# Patient Record
Sex: Male | Born: 1955 | Race: Black or African American | Hispanic: No | Marital: Single | Smoking: Current every day smoker
Health system: Southern US, Community
[De-identification: ages and names within clinical notes are randomized; demographics above are authoritative.]

## PROBLEM LIST (undated history)

## (undated) DIAGNOSIS — K859 Acute pancreatitis without necrosis or infection, unspecified: Secondary | ICD-10-CM

## (undated) DIAGNOSIS — I10 Essential (primary) hypertension: Secondary | ICD-10-CM

---

## 1998-03-19 ENCOUNTER — Emergency Department (HOSPITAL_COMMUNITY): Admission: EM | Admit: 1998-03-19 | Discharge: 1998-03-19 | Payer: Self-pay | Admitting: Emergency Medicine

## 1998-09-11 ENCOUNTER — Inpatient Hospital Stay: Admission: RE | Admit: 1998-09-11 | Discharge: 1998-09-13 | Payer: Self-pay | Admitting: *Deleted

## 1998-09-11 ENCOUNTER — Encounter: Payer: Self-pay | Admitting: Emergency Medicine

## 2000-04-26 ENCOUNTER — Emergency Department (HOSPITAL_COMMUNITY): Admission: EM | Admit: 2000-04-26 | Discharge: 2000-04-26 | Payer: Self-pay | Admitting: Emergency Medicine

## 2000-11-11 ENCOUNTER — Encounter: Payer: Self-pay | Admitting: Emergency Medicine

## 2000-11-11 ENCOUNTER — Emergency Department (HOSPITAL_COMMUNITY): Admission: EM | Admit: 2000-11-11 | Discharge: 2000-11-11 | Payer: Self-pay | Admitting: Emergency Medicine

## 2004-04-14 ENCOUNTER — Emergency Department (HOSPITAL_COMMUNITY): Admission: EM | Admit: 2004-04-14 | Discharge: 2004-04-14 | Payer: Self-pay | Admitting: Emergency Medicine

## 2004-04-23 ENCOUNTER — Inpatient Hospital Stay (HOSPITAL_COMMUNITY): Admission: RE | Admit: 2004-04-23 | Discharge: 2004-04-29 | Payer: Self-pay | Admitting: Internal Medicine

## 2004-04-23 ENCOUNTER — Encounter: Admission: RE | Admit: 2004-04-23 | Discharge: 2004-04-23 | Payer: Self-pay | Admitting: Internal Medicine

## 2004-05-05 ENCOUNTER — Ambulatory Visit (HOSPITAL_COMMUNITY): Admission: RE | Admit: 2004-05-05 | Discharge: 2004-05-05 | Payer: Self-pay | Admitting: Internal Medicine

## 2004-05-24 ENCOUNTER — Encounter: Admission: RE | Admit: 2004-05-24 | Discharge: 2004-05-24 | Payer: Self-pay | Admitting: Internal Medicine

## 2004-08-05 ENCOUNTER — Inpatient Hospital Stay (HOSPITAL_COMMUNITY): Admission: EM | Admit: 2004-08-05 | Discharge: 2004-09-06 | Payer: Self-pay | Admitting: Emergency Medicine

## 2004-08-05 ENCOUNTER — Ambulatory Visit: Payer: Self-pay | Admitting: Internal Medicine

## 2004-08-09 ENCOUNTER — Ambulatory Visit: Payer: Self-pay | Admitting: Infectious Diseases

## 2004-09-13 ENCOUNTER — Ambulatory Visit: Payer: Self-pay | Admitting: Internal Medicine

## 2004-09-30 ENCOUNTER — Ambulatory Visit: Payer: Self-pay | Admitting: Internal Medicine

## 2007-04-19 ENCOUNTER — Ambulatory Visit: Payer: Self-pay | Admitting: Internal Medicine

## 2007-04-20 ENCOUNTER — Ambulatory Visit: Payer: Self-pay | Admitting: *Deleted

## 2010-02-08 ENCOUNTER — Emergency Department (HOSPITAL_COMMUNITY): Admission: EM | Admit: 2010-02-08 | Discharge: 2010-02-08 | Payer: Self-pay | Admitting: Emergency Medicine

## 2010-02-18 ENCOUNTER — Emergency Department (HOSPITAL_COMMUNITY): Admission: EM | Admit: 2010-02-18 | Discharge: 2010-02-18 | Payer: Self-pay | Admitting: Emergency Medicine

## 2011-02-25 NOTE — Discharge Summary (Signed)
NAME:  Elijah Werner, Elijah Werner NO.:  [id number]   MEDICAL RECORD NO.:  [id number]                   PATIENT TYPE:  INP   LOCATION:  5727                                 FACILITY:  [facility]   PHYSICIAN:  Ellie Lunch, M.D.                   DATE OF BIRTH:  [birth year]   DATE OF ADMISSION:  DATE OF DISCHARGE:  04/29/2004                                 DISCHARGE SUMMARY   PRIMARY CARE PHYSICIAN:  Dr. Renae Fickle, Outpatient [facility].   CONSULTATIONS:  Dr. Judie Petit. Ruel Favors.   DISCHARGE DIAGNOSES:  1. Acute on chronic pancreatitis secondary to alcohol abuse.  2. 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  3. Status post CT-guided percutaneous drainage on April 26, 2004.  4. Leukocytosis secondary to #1.  5. Thrombocytosis secondary to #1.  6. History of alcohol use, last drink April 12, 2004.  7. Ongoing tobacco abuse.   DISCHARGE MEDICATIONS:  1. Multivitamin p.o. daily.  2. Nicotine patch 21 mg, to be applied every morning and to be removed at     night.  3. Wellbutrin 150 mg one tablet p.o. daily.   DISPOSITION AND FOLLOWUP:  The patient has been scheduled for a repeat CT  scan of the abdomen on May 05, 2004, for recurrence of the pseudocyst.  He  has been scheduled to see Dr. Miles Costain following the CT scan of the abdomen on  May 05, 2004.  He will continue to keep his drain per interventional  radiologist's instructions.  He has also been set up to have Home Health  come and change dressings daily, and also to drain all the fluid every day.  He has also been given instructions on using oral contrast prior to CT scan  on May 05, 2004.  He will also be followed up by Dr. Renae Fickle in the outpatient  clinic in August.  He will get a CBC at that visit to make sure that the  leukocytosis and the thrombocytosis have resolved.   PROCEDURES DONE:  1. CT scan of the abdomen performed on April 24, 2004, showed a 19 cm     pancreatic pseudocyst, and  evidence of chronic pancreatitis such as large     globular calcifications, ductal dilatation, as well as atrophy of the     pancreas.  2. An ultrasound-guided percutaneous drainage was performed of the     pseudocyst on April 27, 2004, by Dr. Miles Costain and 1400 cc of pancreatic     pseudocyst fluid were aspirated, and a 10-French drainage catheter was     inserted.   ADMISSION HISTORY:  Mr. Fantroy is a 55 year old African American male with a  history of heavy alcohol use and pancreatitis that was admitted because of a  history of a few weeks of nausea, decreased p.o. intake, abdominal  discomfort, and increasing abdominal distension. He had  been evaluated in  the ED prior to admission with mild pancreatitis.  The patient, because of  abdominal distension, the patient is unable to eat any solid food, and has  also been unable to keep down water since the past three to four days.  The  patient was admitted.  The patient denied any blood in stools or any  vomiting.   SUBSTANCE HISTORY:  The patient is a current smoker and smokes 1 to 1.5  packs per day for the past 20 years.  The patient used to drink a lot of  beer for 20 to 30 years but has quit since April 12, 2004.   SOCIAL HISTORY:  The patient is single and is self pay and unemployed, and  lives with mother.   FAMILY MEDICAL HISTORY:  Mother alive at age 79, and she has hypertension.  Father, no history could be obtained.  Siblings:  He has seven siblings, and  almost everybody has hypertension.   PHYSICAL EXAMINATION:  VITAL SIGNS:  Pulse 96, blood pressure 112/73,  temperature 98, respirations 20, O2 saturations 100% on room air.  RESPIRATIONS:  Clear to auscultation bilateral.  CARDIOVASCULAR:  Regular rate and rhythm.  GI:  Abdomen was firm, distended.  It was nontender.  There were some bowel  sounds.  There was no guarding, rigidity or rebound tenderness.  EXTREMITIES:  No cyanosis, clubbing or edema.   ADMISSION LABORATORY  DATA:  Sodium 140, potassium 4.9, chloride 101,  bicarbonate 29, BUN 6, creatinine 0.8.  Glucose 117.  Hemoglobin 12.1 with  MCV of 91.3.  White blood count 22.1, platelets 744,000.  Bilirubin 0.6,  alkaline phosphatase 161.  SGOT 28, SGPT 27, protein 7, album 2.8, calcium  9.5.  Ethyl alcohol was less than 5.  Magnesium 2.4.  Lipase 62, amylase  206.   Abdominal x-ray showed distension of the stomach consistent with gastric  outlet obstruction and extensive pancreatic calcifications.   HOSPITAL COURSE:  Problem #1.  Acute on chronic pancreatitis.  The patient  was initially kept N.P.O. and a nasogastric suction was applied.  The  patient did not complain of any pain during hospitalization.  After  percutaneous drainage of the pseudocyst, the patient was started on clear  liquids and advanced to a regular diet which he tolerated very well prior to  discharge.  He has been advised to quit drinking as well as smoking.  Problem #2.  A 90 cm pseudocyst of the pancreas.  A CT scan was obtained of  the abdomen because of gastric outlet obstruction noted on abdominal film.  It indicated a 19 cm pseudocyst of the pancreas.  A GI consultation was  initially obtained, and Dr. Marina Goodell was consulted.  It was later determined  that Dr. Marina Goodell does not do any endoscopic stenting of the pseudocyst  anymore.  Following that, a surgical consultation was obtained.  Finally a  surgery consultation was obtained, and they suggested that the patient get a  CT guided percutaneous drainage, and if that were not possible, then the  patient will need to have assist gastrostomy.  Thus, interventional  radiology was consulted, and it was agreed that the patient get an  ultrasound-guided, percutaneous drainage. This procedure was performed on  April 27, 2004, and 1400 cc of cloudy, green fluid were removed, and a 10 -  [place] drain was inserted.  Following drainage, the patient was started on clear liquids and advanced  to a regular diet, which the patient tolerated  very well.  The patient is to continue having the drain placed in the  pseudocyst, and will be followed up by interventional radiology in one week.  The patient has also been scheduled for a followup CT scan of the abdomen  for recurrence of the pseudocyst on May 05, 2004, prior to the followup  appointment with the radiologist.  Home Health has also been arranged for  daily dressing changes as well as emptying the drain.  Problem #3. Leukocytosis most likely secondary to #1.  The patient's white  blood cell count continued to decrease during hospitalization stay, and the  discharge white blood count was 16.3.  The patient will receive followup CBC  in three to four weeks to confirm resolution.  Blood cultures were obtained  and were negative.  Also pseudocyst fluid was sent for cell count with  differential as well as gram stain and culture, and was negative for any  white blood cells or organisms.  Problem #4.  Thrombocytosis.  The patient continued to have an elevated  platelet count during hospitalization stay.  This was thought to be reactive  thrombocytosis secondary to the pancreatitis.  A ferritin level was obtained  to rule out iron deficiency anemia and was 340.  A repeat followup CBC will  be obtained in three to four weeks to confirm resolution.  Problem #4.  Alcohol use.  The patient's last drink was on April 12, 2004.  The patient is determined to quit drinking, and has been strongly advised to  do so.  Also, the patient was on thiamine and folate during hospitalization.  He was also carefully monitored for DT's and did not experience any DT's  during hospitalization.  RBC and folate level was obtained and was normal  (346).  The patient will be discharged home on a multivitamin to be taken  every day.  Problem #5.  Ongoing tobacco use.  The patient smokes 1 to 1-/2 packs per  day.  He was placed on a nicotine patch during  hospitalization stay which  proved to be very beneficial per patient.  He is being discharged home on  nicotine patches as well as Wellbutrin samples.  He is very motivated to  quit smoking.  This will be addressed again during followup visits.  The  patient's oxygenation saturations were between 96 and 98% on room air during  hospitalization.  The patient did not show any signs of chronic obstructive  pulmonary disease.   DISCHARGE LABORATORY DATA:  BMP:  Sodium 140, potassium 4.9, chloride 106,  bicarbonate 28, glucose 94, BUN 1, creatinine 0.8, calcium 9.2.  Hemoglobin  12.1.  White blood count 16.3, platelets 857,000, MCV 91.9.   Pertinent labs, TSH 1.2, B12 450, ferritin 340, rbc's __________and folate  346, and amylase 206, and lipase 62.   DISCHARGE VITALS:  Temperature 98.8, pulse 76, respirations 20, blood  pressure 123/75, O2 saturations 98% on room air.   DISCHARGE INSTRUCTIONS:  1. The patient has been instructed on quitting smoking as well as alcohol    use.  He has also been instructed on a low fat diet because of chronic     pancreatitis.  The patient does not have any malabsorption symptoms as of     now.  He has been provided with a handout on low-fat food options.  2. He has also been instructed on all of his followup appointments.  Ellie Lunch, M.D.    BP/MEDQ  D:  04/28/2004  T:  04/29/2004  Job:  [id number]

## 2011-02-25 NOTE — Consult Note (Signed)
NAME:  Elijah Werner, Elijah Werner                            ACCOUNT NO.:  [id number]   MEDICAL RECORD NO.:  [id number]                   PATIENT TYPE:  INP   LOCATION:  5727                                 FACILITY:  [facility]   PHYSICIAN:  Gabrielle Dare. Janee Morn, M.D.             DATE OF BIRTH:  [birth year]   DATE OF CONSULTATION:  04/26/2004  DATE OF DISCHARGE:                                   CONSULTATION   REASON FOR CONSULTATION:  Pancreatic pseudocyst.   HISTORY OF PRESENT ILLNESS:  The patient is a 55 year old African American  male with a history of alcohol abuse who was admitted with gastric symptoms  to the internal medicine teaching service.  Workup revealed a 19 cm acute on  chronic pancreatitis with a large pancreatic pseudocyst.  The patient has  been treated with NG tube decompression and volume resuscitation.  He was  evaluated by GI for possible drainage of the pseudocyst endoscopically.  They felt it was too large of a pseudocyst.  It is causing primary gastric  outlet obstructive symptoms and I was asked to evaluate him.  He currently  denies any significant abdominal pain.   PAST MEDICAL HISTORY:  Alcohol abuse and pancreatitis.   PAST SURGICAL HISTORY:  Inguinal hernia at age 39.   MEDICATIONS CURRENTLY:  Protonix, vitamin B1, Phenergan, nicotine patch,  morphine.   SOCIAL HISTORY:  He has a history of heavy alcohol abuse, though he has not  drank since July 4.  He is also a current smoker.  He is single.  He was  previously in the [employer]   FAMILY HISTORY:  His mother is 67 years old and is alive and has high blood  pressure.  He has several siblings, all with hypertension.  He does not know  of any history in his father.   ALLERGIES:  No known drug allergies.   REVIEW OF SYMPTOMS:  GENERAL:  Some weight loss, but otherwise negative.  CARDIOVASCULAR:  Negative.  RESPIRATORY:  Negative.  GI:  Nausea and  vomiting and abdominal pain.  GU:  Negative.  NEUROLOGICAL:  Negative.   The  remainder of review of systems is  negative.   PHYSICAL EXAMINATION:  VITAL SIGNS:  Temperature 99, blood pressure 124/80,  heart rate 74, respirations 20.  GENERAL:  He is awake, alert, in no acute distress.  HEENT:  Pupils are equal.  NECK:  Supple with no tenderness or masses.  LUNGS:  Clear to auscultation with normal respiratory excursion.  HEART:  Regular rate and rhythm, PMI palpable along the left chest.  ABDOMEN:  Distended, there are bowel sounds present, no appreciable  tenderness is noted.  He has a definite fullness in his left upper quadrant  area.  He has no inguinal herniae.  SKIN:  Warm and dry.   LABORATORY DATA:  White blood cell count 19.3, hemoglobin 12, platelets 742.  Basic metabolic profile  was unremarkable except for a glucose of 126.  Other  data reviewed included CT scan demonstrating a 19 cm pancreatic pseudocyst  abutting his stomach.   IMPRESSION:  Large pancreatic pseudocyst approximately 19 cm with gastric  outlet obstructive symptoms.   RECOMMENDATIONS:  I feel this pseudocyst would be best treated by CT guided  percutaneous drainage done by interventional radiology.  If that is not  possible, we would need to proceed with a cyst gastrostomy in the operating  room.  I discussed this recommendation with Dr. Lavera Guise from the patient's  primary service and they are going to discuss things with radiology.  I will  follow along with you and if it is not possible to drain this percutaneously  by radiology, we will plan on surgery this week.   Thank you very much for this consultation.                                               Gabrielle Dare Janee Morn, M.D.    BET/MEDQ  D:  04/26/2004  T:  04/26/2004  Job:  [id number]

## 2011-05-10 ENCOUNTER — Emergency Department (HOSPITAL_COMMUNITY)
Admission: EM | Admit: 2011-05-10 | Discharge: 2011-05-10 | Disposition: A | Discharge status: Home / Self Care | Payer: Non-veteran care | Attending: Emergency Medicine | Admitting: Emergency Medicine

## 2011-05-10 DIAGNOSIS — H9190 Unspecified hearing loss, unspecified ear: Secondary | ICD-10-CM | POA: Insufficient documentation

## 2011-05-10 DIAGNOSIS — H6120 Impacted cerumen, unspecified ear: Secondary | ICD-10-CM | POA: Insufficient documentation

## 2013-06-14 ENCOUNTER — Encounter (HOSPITAL_COMMUNITY): Payer: Self-pay | Admitting: Emergency Medicine

## 2013-06-14 ENCOUNTER — Emergency Department (HOSPITAL_COMMUNITY)
Admission: EM | Admit: 2013-06-14 | Discharge: 2013-06-14 | Disposition: A | Discharge status: Home / Self Care | Payer: Non-veteran care | Attending: Emergency Medicine | Admitting: Emergency Medicine

## 2013-06-14 DIAGNOSIS — F17200 Nicotine dependence, unspecified, uncomplicated: Secondary | ICD-10-CM | POA: Insufficient documentation

## 2013-06-14 DIAGNOSIS — L0231 Cutaneous abscess of buttock: Secondary | ICD-10-CM | POA: Insufficient documentation

## 2013-06-14 MED ORDER — HYDROCODONE-ACETAMINOPHEN 5-325 MG PO TABS: 1.0000 | ORAL_TABLET | ORAL | Status: DC | PRN

## 2013-06-14 NOTE — ED Notes (Signed)
Pt c/o left sided buttocks abscess; pt denies drainage

## 2013-06-14 NOTE — ED Provider Notes (Signed)
Medical screening examination/treatment/procedure(s) were performed by non-physician practitioner and as supervising physician I was immediately available for consultation/collaboration.   Dagmar Hait, MD 06/14/13 1536

## 2013-06-14 NOTE — ED Notes (Addendum)
States has had "sore" on left buttock x 3 days.

## 2013-06-14 NOTE — ED Provider Notes (Signed)
CSN: [id number]     Arrival date & time 06/14/13  1144 History   First MD Initiated Contact with Patient 06/14/13 1302     Chief Complaint  Patient presents with  . Abscess   (Consider location/radiation/quality/duration/timing/severity/associated sxs/prior Treatment) Patient is a 57 y.o. male presenting with abscess. The history is provided by the patient. No language interpreter was used.  Abscess Location:  Ano-genital Ano-genital abscess location:  L buttock Abscess quality: painful   Abscess quality: not draining   Red streaking: no   Associated symptoms: no fever   Associated symptoms comment:  Painful swollen area to left buttock that is now causing pain when sitting. No drainage.    History reviewed. No pertinent past medical history. History reviewed. No pertinent past surgical history. History reviewed. No pertinent family history. History  Substance Use Topics  . Smoking status: Current Every Day Smoker  . Smokeless tobacco: Not on file  . Alcohol Use: No    Review of Systems  Constitutional: Negative for fever.  Skin:       Hard nodular lesion to left buttock without drainage, ulceration or bleeding. No redness. No perirectal pain or swelling.     Allergies  Review of patient's allergies indicates no known allergies.  Home Medications   Current Outpatient Rx  Name  Route  Sig  Dispense  Refill  . dextran 70-hypromellose (TEARS RENEWED) ophthalmic solution   Both Eyes   Place 1 drop into both eyes 3 (three) times daily as needed.         . Multiple Vitamins-Minerals (MULTIVITAMIN WITH MINERALS) tablet   Oral   Take 1 tablet by mouth daily.         Marland Kitchen VITAMIN E PO   Oral   Take 1 capsule by mouth daily.          BP 126/74  Temp(Src) 97.7 F (36.5 C) (Oral)  Resp 18  Wt 125 lb 3.2 oz (56.79 kg)  SpO2 95% Physical Exam  ED Course  Procedures (including critical care time) Labs Review Labs Reviewed - No data to display Imaging Review No  results found.  MDM  No diagnosis found. 1. Sebaceous cyst/abscess  INCISION AND DRAINAGE Performed by: Elpidio Anis A Consent: Verbal consent obtained. Risks and benefits: risks, benefits and alternatives were discussed Type: abscess  Body area: left buttock  Anesthesia: local infiltration  Incision was made with a scalpel.  Local anesthetic: lidocaine 2% w/ epinephrine  Anesthetic total: 2 ml  Complexity: complex Blunt dissection to break up loculations  Drainage: purulent  Drainage amount: moderate, include sebaceous material  Packing material: 1/4 in iodoform gauze  Patient tolerance: Patient tolerated the procedure well with no immediate complications.       Arnoldo Hooker, PA-C 06/14/13 1340

## 2013-06-16 ENCOUNTER — Encounter (HOSPITAL_COMMUNITY): Payer: Self-pay | Admitting: *Deleted

## 2013-06-16 ENCOUNTER — Emergency Department (HOSPITAL_COMMUNITY)
Admission: EM | Admit: 2013-06-16 | Discharge: 2013-06-16 | Disposition: A | Discharge status: Home / Self Care | Payer: Non-veteran care | Attending: Emergency Medicine | Admitting: Emergency Medicine

## 2013-06-16 DIAGNOSIS — Z4801 Encounter for change or removal of surgical wound dressing: Secondary | ICD-10-CM | POA: Insufficient documentation

## 2013-06-16 DIAGNOSIS — F17200 Nicotine dependence, unspecified, uncomplicated: Secondary | ICD-10-CM | POA: Insufficient documentation

## 2013-06-16 DIAGNOSIS — Z5189 Encounter for other specified aftercare: Secondary | ICD-10-CM

## 2013-06-16 DIAGNOSIS — Z79899 Other long term (current) drug therapy: Secondary | ICD-10-CM | POA: Insufficient documentation

## 2013-06-16 NOTE — ED Provider Notes (Signed)
CSN: [id number]     Arrival date & time 06/16/13  4098 History   First MD Initiated Contact with Patient 06/16/13 0825     Chief Complaint  Patient presents with  . Wound Check   (Consider location/radiation/quality/duration/timing/severity/associated sxs/prior Treatment) HPI This 57 year old male is here for a scheduled wound check after having incision and drainage of left buttocks abscess 2 days ago, he has no complaints, his dressing has remained in place as she was previously directed and has a drainage packing in place, he has had no fever, minimal intermittent pain, no constant pain, no severe pain, no surrounding redness, no abdominal pain vomiting or other concerns. History reviewed. No pertinent past medical history. History reviewed. No pertinent past surgical history. No family history on file. History  Substance Use Topics  . Smoking status: Current Every Day Smoker  . Smokeless tobacco: Not on file  . Alcohol Use: No    Review of Systems See HPI. Allergies  Review of patient's allergies indicates no known allergies.  Home Medications   Current Outpatient Rx  Name  Route  Sig  Dispense  Refill  . dextran 70-hypromellose (TEARS RENEWED) ophthalmic solution   Both Eyes   Place 1 drop into both eyes 3 (three) times daily as needed.         . Multiple Vitamins-Minerals (MULTIVITAMIN WITH MINERALS) tablet   Oral   Take 1 tablet by mouth daily.         Marland Kitchen VITAMIN E PO   Oral   Take 1 capsule by mouth daily.          BP 150/71  Pulse 64  Temp(Src) 97.8 F (36.6 C) (Oral)  Resp 18  SpO2 100% Physical Exam  Nursing note and vitals reviewed. Constitutional:  Awake, alert, nontoxic appearance.  HENT:  Head: Atraumatic.  Eyes: Right eye exhibits no discharge. Left eye exhibits no discharge.  Neck: Neck supple.  Cardiovascular: Normal rate and regular rhythm.   No murmur heard. Pulmonary/Chest: Effort normal and breath sounds normal. No respiratory  distress. He has no wheezes. He has no rales. He exhibits no tenderness.  Abdominal: Soft. Bowel sounds are normal. There is no tenderness. There is no rebound.  Genitourinary:  Packing removed from left buttocks drained abscess area, minimal purulence drainage, no surrounding erythema to suggest cellulitis, minimal if any localized tenderness, no fluctuance no crepitus, abscess appears to be healing well  Musculoskeletal: He exhibits no tenderness.  Baseline ROM, no obvious new focal weakness.  Neurological: He is alert.  Mental status and motor strength appears baseline for patient and situation.  Skin: No rash noted.  Psychiatric: He has a normal mood and affect.    ED Course  Procedures (including critical care time) Patient / Family / Caregiver informed of clinical course, understand medical decision-making process, and agree with plan. Labs Review Labs Reviewed - No data to display Imaging Review No results found.  MDM   1. Wound check, abscess    I doubt any other EMC precluding discharge at this time including, but not necessarily limited to the following:sepsis, nec fasciitis.    Hurman Horn, MD 06/17/13 [phone]

## 2013-06-16 NOTE — ED Notes (Signed)
Pt requesting wound re-check of L buttock abscess. Site is healing appropriately. No drainage noted. Denies pain at the time.

## 2013-06-16 NOTE — ED Notes (Signed)
Pt is here to have abscess to buttocks rechecked and pt has packing in site.

## 2013-06-16 NOTE — ED Notes (Signed)
Pt instructed to follow up with [facility] in x2 weeks for re-check. Pt discharged home, has no further questions.

## 2015-04-29 ENCOUNTER — Emergency Department (HOSPITAL_COMMUNITY): Payer: Non-veteran care

## 2015-04-29 ENCOUNTER — Emergency Department (HOSPITAL_COMMUNITY)
Admission: EM | Admit: 2015-04-29 | Discharge: 2015-04-29 | Disposition: A | Discharge status: Home / Self Care | Payer: Self-pay | Attending: Emergency Medicine | Admitting: Emergency Medicine

## 2015-04-29 ENCOUNTER — Emergency Department (HOSPITAL_COMMUNITY): Payer: Self-pay

## 2015-04-29 ENCOUNTER — Encounter (HOSPITAL_COMMUNITY): Payer: Self-pay | Admitting: General Practice

## 2015-04-29 DIAGNOSIS — R001 Bradycardia, unspecified: Secondary | ICD-10-CM | POA: Insufficient documentation

## 2015-04-29 DIAGNOSIS — D72829 Elevated white blood cell count, unspecified: Secondary | ICD-10-CM | POA: Insufficient documentation

## 2015-04-29 DIAGNOSIS — R109 Unspecified abdominal pain: Secondary | ICD-10-CM

## 2015-04-29 DIAGNOSIS — Z720 Tobacco use: Secondary | ICD-10-CM | POA: Insufficient documentation

## 2015-04-29 DIAGNOSIS — R319 Hematuria, unspecified: Secondary | ICD-10-CM

## 2015-04-29 DIAGNOSIS — M62830 Muscle spasm of back: Secondary | ICD-10-CM | POA: Insufficient documentation

## 2015-04-29 DIAGNOSIS — R918 Other nonspecific abnormal finding of lung field: Secondary | ICD-10-CM | POA: Insufficient documentation

## 2015-04-29 DIAGNOSIS — Z79899 Other long term (current) drug therapy: Secondary | ICD-10-CM | POA: Insufficient documentation

## 2015-04-29 DIAGNOSIS — M546 Pain in thoracic spine: Secondary | ICD-10-CM

## 2015-04-29 DIAGNOSIS — N200 Calculus of kidney: Secondary | ICD-10-CM | POA: Insufficient documentation

## 2015-04-29 DIAGNOSIS — Z8719 Personal history of other diseases of the digestive system: Secondary | ICD-10-CM | POA: Insufficient documentation

## 2015-04-29 DIAGNOSIS — E875 Hyperkalemia: Secondary | ICD-10-CM | POA: Insufficient documentation

## 2015-04-29 HISTORY — DX: Acute pancreatitis without necrosis or infection, unspecified: K85.90

## 2015-04-29 LAB — COMPREHENSIVE METABOLIC PANEL
ALBUMIN: 4.4 g/dL (ref 3.5–5.0)
ALT: 24 U/L (ref 17–63)
ANION GAP: 4 — AB (ref 5–15)
AST: 26 U/L (ref 15–41)
Alkaline Phosphatase: 77 U/L (ref 38–126)
BILIRUBIN TOTAL: 0.6 mg/dL (ref 0.3–1.2)
BUN: 10 mg/dL (ref 6–20)
CO2: 31 mmol/L (ref 22–32)
Calcium: 10.1 mg/dL (ref 8.9–10.3)
Chloride: 107 mmol/L (ref 101–111)
Creatinine, Ser: 0.8 mg/dL (ref 0.61–1.24)
GFR calc Af Amer: >60 mL/min (ref >60)
GFR calc non Af Amer: >60 mL/min (ref >60)
GLUCOSE: 82 mg/dL (ref 65–99)
Potassium: 6 mmol/L — ABNORMAL HIGH (ref 3.5–5.1)
SODIUM: 142 mmol/L (ref 135–145)
TOTAL PROTEIN: 6.9 g/dL (ref 6.5–8.1)

## 2015-04-29 LAB — URINALYSIS, ROUTINE W REFLEX MICROSCOPIC
Glucose, UA: NEGATIVE mg/dL
Ketones, ur: 15 mg/dL — AB
NITRITE: NEGATIVE
PH: 5.5 (ref 5.0–8.0)
Protein, ur: 100 mg/dL — AB
SPECIFIC GRAVITY, URINE: 1.025 (ref 1.005–1.030)
UROBILINOGEN UA: 1 mg/dL (ref 0.0–1.0)

## 2015-04-29 LAB — CBC WITH DIFFERENTIAL/PLATELET
BASOS ABS: 0 10*3/uL (ref 0.0–0.1)
BASOS PCT: 0 % (ref 0–1)
EOS ABS: 0.3 10*3/uL (ref 0.0–0.7)
Eosinophils Relative: 2 % (ref 0–5)
HCT: 41.9 % (ref 39.0–52.0)
HEMOGLOBIN: 14.1 g/dL (ref 13.0–17.0)
LYMPHS ABS: 2.4 10*3/uL (ref 0.7–4.0)
LYMPHS PCT: 17 % (ref 12–46)
MCH: 31.8 pg (ref 26.0–34.0)
MCHC: 33.7 g/dL (ref 30.0–36.0)
MCV: 94.6 fL (ref 78.0–100.0)
MONO ABS: 0.6 10*3/uL (ref 0.1–1.0)
MONOS PCT: 4 % (ref 3–12)
NEUTROS ABS: 11 10*3/uL — AB (ref 1.7–7.7)
Neutrophils Relative %: 77 % (ref 43–77)
Platelets: 168 10*3/uL (ref 150–400)
RBC: 4.43 MIL/uL (ref 4.22–5.81)
RDW: 15 % (ref 11.5–15.5)
WBC: 14.3 10*3/uL — AB (ref 4.0–10.5)

## 2015-04-29 LAB — URINE MICROSCOPIC-ADD ON

## 2015-04-29 LAB — POTASSIUM: Potassium: 4.5 mmol/L (ref 3.5–5.1)

## 2015-04-29 LAB — CK: CK TOTAL: 176 U/L (ref 49–397)

## 2015-04-29 LAB — I-STAT TROPONIN, ED: Troponin i, poc: 0 ng/mL (ref 0.00–0.08)

## 2015-04-29 LAB — LIPASE, BLOOD: LIPASE: 17 U/L — AB (ref 22–51)

## 2015-04-29 MED ORDER — HYDROCODONE-ACETAMINOPHEN 5-325 MG PO TABS: 1.0000 | ORAL_TABLET | Freq: Four times a day (QID) | ORAL | Status: DC | PRN

## 2015-04-29 MED ORDER — IOHEXOL 300 MG/ML  SOLN
80.0000 mL | Freq: Once | INTRAMUSCULAR | Status: AC | PRN
  Administered 2015-04-29: 100 mL via INTRAVENOUS

## 2015-04-29 MED ORDER — MORPHINE SULFATE 2 MG/ML IJ SOLN
2.0000 mg | Freq: Once | INTRAMUSCULAR | Status: AC
  Administered 2015-04-29: 2 mg via INTRAVENOUS
  Filled 2015-04-29: qty 1

## 2015-04-29 MED ORDER — TAMSULOSIN HCL 0.4 MG PO CAPS: 0.4000 mg | ORAL_CAPSULE | Freq: Every day | ORAL | Status: DC

## 2015-04-29 MED ORDER — SODIUM CHLORIDE 0.9 % IV BOLUS (SEPSIS)
1000.0000 mL | Freq: Once | INTRAVENOUS | Status: AC
  Administered 2015-04-29: 1000 mL via INTRAVENOUS

## 2015-04-29 MED ORDER — HYDROCODONE-ACETAMINOPHEN 5-325 MG PO TABS
1.0000 | ORAL_TABLET | Freq: Once | ORAL | Status: AC
  Administered 2015-04-29: 1 via ORAL
  Filled 2015-04-29: qty 1

## 2015-04-29 NOTE — ED Provider Notes (Signed)
Pt awaits chest CT scan to evaluate for lung mass.  ?smoking hx.  No PCP.  Need close f/u.  L flank pain.    4:04 PM Chest CT scan showing evidence of a benign tumor (hamartoma).  No evidence of lung infection.  Since pt has L flank pain, and finding which may suggest kidney stone, will provide pain medication and f/u with urologist for further care.  Resources provided and encourage pt to f/u with PCP for monitoring of his health.    BP 124/94 mmHg  Pulse 42  Temp(Src) 97.9 F (36.6 C) (Oral)  Resp 17  Ht  (1.727 m)  Wt 135 lb (61.236 kg)  BMI 20.53 kg/m2  SpO2 97%  I have reviewed nursing notes and vital signs. I personally viewed the imaging tests through PACS system and agrees with radiologist's intepretation I reviewed available ER/hospitalization records through the EMR  Results for orders placed or performed during the hospital encounter of 04/29/15  Urinalysis, Routine w reflex microscopic (not at [facility])  Result Value Ref Range   Color, Urine Kost (A) YELLOW   APPearance TURBID (A) CLEAR   Specific Gravity, Urine 1.025 1.005 - 1.030   pH 5.5 5.0 - 8.0   Glucose, UA NEGATIVE NEGATIVE mg/dL   Hgb urine dipstick LARGE (A) NEGATIVE   Bilirubin Urine MODERATE (A) NEGATIVE   Ketones, ur 15 (A) NEGATIVE mg/dL   Protein, ur 829 (A) NEGATIVE mg/dL   Urobilinogen, UA 1.0 0.0 - 1.0 mg/dL   Nitrite NEGATIVE NEGATIVE   Leukocytes, UA MODERATE (A) NEGATIVE  CBC with Differential  Result Value Ref Range   WBC 14.3 (H) 4.0 - 10.5 K/uL   RBC 4.43 4.22 - 5.81 MIL/uL   Hemoglobin 14.1 13.0 - 17.0 g/dL   HCT 56.2 13.0 - 86.5 %   MCV 94.6 78.0 - 100.0 fL   MCH 31.8 26.0 - 34.0 pg   MCHC 33.7 30.0 - 36.0 g/dL   RDW 78.4 69.6 - 29.5 %   Platelets 168 150 - 400 K/uL   Neutrophils Relative % 77 43 - 77 %   Neutro Abs 11.0 (H) 1.7 - 7.7 K/uL   Lymphocytes Relative 17 12 - 46 %   Lymphs Abs 2.4 0.7 - 4.0 K/uL   Monocytes Relative 4 3 - 12 %   Monocytes Absolute 0.6 0.1 - 1.0 K/uL    Eosinophils Relative 2 0 - 5 %   Eosinophils Absolute 0.3 0.0 - 0.7 K/uL   Basophils Relative 0 0 - 1 %   Basophils Absolute 0.0 0.0 - 0.1 K/uL  Comprehensive metabolic panel  Result Value Ref Range   Sodium 142 135 - 145 mmol/L   Potassium 6.0 (H) 3.5 - 5.1 mmol/L   Chloride 107 101 - 111 mmol/L   CO2 31 22 - 32 mmol/L   Glucose, Bld 82 65 - 99 mg/dL   BUN 10 6 - 20 mg/dL   Creatinine, Ser 2.84 0.61 - 1.24 mg/dL   Calcium 13.2 8.9 - 44.0 mg/dL   Total Protein 6.9 6.5 - 8.1 g/dL   Albumin 4.4 3.5 - 5.0 g/dL   AST 26 15 - 41 U/L   ALT 24 17 - 63 U/L   Alkaline Phosphatase 77 38 - 126 U/L   Total Bilirubin 0.6 0.3 - 1.2 mg/dL   GFR calc non Af Amer >60 >60 mL/min   GFR calc Af Amer >60 >60 mL/min   Anion gap 4 (L) 5 - 15  Lipase, blood  Result Value Ref Range   Lipase 17 (L) 22 - 51 U/L  CK  Result Value Ref Range   Total CK 176 49 - 397 U/L  Urine microscopic-add on  Result Value Ref Range   WBC, UA 3-6 <3 WBC/hpf   RBC / HPF TOO NUMEROUS TO COUNT <3 RBC/hpf   Bacteria, UA FEW (A) RARE  Potassium  Result Value Ref Range   Potassium 4.5 3.5 - 5.1 mmol/L  I-stat troponin, ED  Result Value Ref Range   Troponin i, poc 0.00 0.00 - 0.08 ng/mL   Comment 3           Ct Chest W Contrast  04/29/2015   CLINICAL DATA:  59 year old male with left lower lung mass on CT Abdomen and Pelvis today. Initial encounter.  EXAM: CT CHEST WITH CONTRAST  TECHNIQUE: Multidetector CT imaging of the chest was performed during intravenous contrast administration.  CONTRAST:  OMNIPAQUE IOHEXOL 300 MG/ML  SOLN  COMPARISON:  CT Abdomen and Pelvis 1345 hours today. CT Abdomen and Pelvis 05/05/2004.  FINDINGS: Negative thoracic inlet. Mild cardiomegaly. No pericardial effusion. Minimal calcified plaque at the left subclavian artery origin, otherwise negative visualized aorta. Other major mediastinal vascular structures appear within normal limits. No mediastinal or hilar lymphadenopathy.  No  axillary lymphadenopathy. Stable visualized upper abdominal viscera, including dilated main pancreatic duct and pancreatic tail atrophy. Mild periportal edema suggested.  No osseous abnormality identified.  Minimal retained secretions along the left wall of the upper trachea (series 3, image 11). Major airways otherwise are patent. The right lung is negative. No pleural effusion.  There is mild mostly dependent opacity in the left lower lobe compatible. There is a round mildly lobulated left lower lobe anterior basal segment lung nodule measuring 18 x 24 x 17 mm (AP by transverse by CC). This has a course 7 mm calcification which is slightly off center. Elsewhere the lesion is somewhat low density (20-30 Hounsfield units). This is identified in 2005 (series 2, image 6 at that time) measuring up to 14 mm. The left lung otherwise is negative.  IMPRESSION: 1. 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 2. Mild patchy left lower lobe opacity elsewhere might reflect scarring or atelectasis. Trace retained secretions in the trachea. 3. No other acute findings in the chest. 4. Stable visualized upper abdomen including pancreatic atrophy with pancreatic ductal dilatation, and suggestion of mild periportal edema.   Electronically Signed   By: Odessa Fleming M.D.   On: 04/29/2015 15:54   Ct Renal Stone Study  04/29/2015   CLINICAL DATA:  Left flank pain, hematuria  EXAM: CT ABDOMEN AND PELVIS WITHOUT CONTRAST  TECHNIQUE: Multidetector CT imaging of the abdomen and pelvis was performed following the standard protocol without IV contrast.  COMPARISON:  CT abdomen pelvis dated 05/05/2004.  FINDINGS: Evaluation of the solid organs is limited by the lack of intravascular contrast. Again noted is sequela of chronic pancreatitis with associated dystrophic calcifications throughout the pancreas and  commensurate pancreatic duct dilatation. Overall appearance of the pancreas is grossly stable. Liver, spleen, gallbladder, and adrenal glands are unremarkable.  There is a punctate nonobstructing right renal stone. There is a 4 x 3 mm oval calcification in the left lower pelvis which is of uncertain relationship to the distal left ureter. There is, however, a mild left-sided  hydronephrosis suggesting that this is an intraureteral stone  The bowel is grossly normal in caliber and configuration throughout. Moderate amount of stool and gas throughout the colon. Appendix is not convincingly seen but there are no inflammatory changes about the cecum to suggest acute appendicitis. No free fluid or abscess collection identified. No free intraperitoneal air. No enlarged lymph nodes seen. Abdominal aorta is normal in caliber.  There is a partially calcified solid-appearing mass within the left lower lung (left lower lobe) measuring 2.3 x 1.9 cm (axial image 7 and coronal image 62). Lung bases otherwise clear. Mild degenerative change noted within the lumbar spine but no acute osseous abnormality.  IMPRESSION: 1. 4 mm calcification in the left lower pelvis is of uncertain relationship to the distal left ureter but is suspected to be an intraureteral stone given the mild left-sided hydronephrosis. 2. Punctate nonobstructing right renal stone. 3. Sequela of chronic pancreatitis which appears grossly stable compared to a previous CT. 4. Solid-appearing partially-calcified mass at the left lung base, within the left lower lobe, measuring 2.3 x 1.9 cm. The central calcification within the mass may indicate benign granuloma, however, the size of the mass is suspicious for a neoplastic process. Recommend a dedicated complete chest CT for more detailed characterization of this left lower lobe mass and to exclude other possible pulmonary masses/nodules.   Electronically Signed   By: Bary RichardStan  Maynard M.D.   On: 04/29/2015 14:22       Fayrene HelperBowie Resha Filippone, PA-C 05/01/15 1502  Arby BarretteMarcy Pfeiffer, MD 05/07/15 [phone]

## 2015-04-29 NOTE — ED Notes (Signed)
Pt presents today with complaints of left abdominal and back pain. Pt states "pain is worse with noise, the louder you talk the worse I hurt". Pt reporting pain started early this morning, after assisting his mother to the restroom. Pt asked to describe pain as sharp intermittent pain. Pt reporting pain is a 5/10. Pt is A/O.

## 2015-04-29 NOTE — ED Provider Notes (Signed)
CSN: [id number]     Arrival date & time 04/29/15  1059 History   First MD Initiated Contact with Patient 04/29/15 1122     Chief Complaint  Patient presents with  . Abdominal Pain     (Consider location/radiation/quality/duration/timing/severity/associated sxs/prior Treatment) HPI Comments: Renee PainGary T Seltzer is a 59 y.o. male with a PMHx of pancreatitis, who presents to the ED with complaints of gradual onset left lateral abdomen wall/back pain that began this morning around 9 AM when he was helping his mother get on the toilet. 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 He thinks he pulled a muscle. He reports that the pain is 5/10 constant sharp pain located along the mid axillary line of the lateral left abdomen, nonradiating, worse with "noise" and palpation to the area, with no treatments tried prior to arrival. He reports that the pain has improved since onset. He denies any fevers, chills, CP, SOB, nausea, vomiting, diarrhea, constipation, obstipation, melena, hematochezia, flank pain, dysuria, hematuria, penile discharge, rectal pain, numbness, tingling, weakness, recent travel, sick contacts, suspicious food intake, alcohol use, NSAID use, or recent antibiotics.  Patient is a 59 y.o. male presenting with abdominal pain. The history is provided by the patient. No language interpreter was used.  Abdominal Pain Pain location: L lateral abdominal wall. Pain quality: sharp   Pain radiates to:  Does not radiate Pain severity:  Moderate Onset quality:  Gradual Duration:  2 hours (~9am) Timing:  Constant Progression:  Unchanged Chronicity:  New Context: not recent illness, not recent travel, not sick contacts and not suspicious food intake   Context comment:  Heavy lifting Relieved by:  None tried Worsened by:  Palpation (palpation to area and "noise") Ineffective treatments:  None tried Associated symptoms: no chest pain, no chills, no constipation, no diarrhea, no dysuria, no fever, no flatus, no  hematemesis, no hematochezia, no hematuria, no melena, no nausea, no shortness of breath and no vomiting   Risk factors: no alcohol abuse and no NSAID use     Past Medical History  Diagnosis Date  . Pancreatitis    History reviewed. No pertinent past surgical history. No family history on file. History  Substance Use Topics  . Smoking status: Current Every Day Smoker -- 0.50 packs/day    Types: Cigarettes  . Smokeless tobacco: Not on file  . Alcohol Use: No    Review of Systems  Constitutional: Negative for fever and chills.  Respiratory: Negative for shortness of breath.   Cardiovascular: Negative for chest pain.  Gastrointestinal: Positive for abdominal pain (L lateral abd wall). Negative for nausea, vomiting, diarrhea, constipation, blood in stool, melena, hematochezia, rectal pain, flatus and hematemesis.  Genitourinary: Negative for dysuria, hematuria, flank pain and discharge.  Musculoskeletal: Positive for back pain (L lateral abdomen/thoracic back). Negative for myalgias and arthralgias.  Skin: Negative for color change.  Allergic/Immunologic: Negative for immunocompromised state.  Neurological: Negative for weakness and numbness.  Psychiatric/Behavioral: Negative for confusion.   10 Systems reviewed and are negative for acute change except as noted in the HPI.    Allergies  Review of patient's allergies indicates no known allergies.  Home Medications   Prior to Admission medications   Medication Sig Start Date End Date Taking? Authorizing Provider  dextran 70-hypromellose (TEARS RENEWED) ophthalmic solution Place 1 drop into both eyes 3 (three) times daily as needed.    Historical Provider, MD  Multiple Vitamins-Minerals (MULTIVITAMIN WITH MINERALS) tablet Take 1 tablet by mouth daily.    Historical Provider, MD  VITAMIN E PO Take 1 capsule by mouth daily.    Historical Provider, MD   BP 156/87 mmHg  Pulse 53  Temp(Src) 97.9 F (36.6 C) (Oral)  Resp 18  Ht 5'  8" (1.727 m)  Wt 135 lb (61.236 kg)  BMI 20.53 kg/m2  SpO2 100% Physical Exam  Constitutional: He is oriented to person, place, and time. Vital signs are normal. He appears well-developed and well-nourished.  Non-toxic appearance. No distress.  Afebrile, nontoxic, NAD  HENT:  Head: Normocephalic and atraumatic.  Mouth/Throat: Oropharynx is clear and moist and mucous membranes are normal.  Eyes: Conjunctivae and EOM are normal. Right eye exhibits no discharge. Left eye exhibits no discharge.  Neck: Normal range of motion. Neck supple.  Cardiovascular: Regular rhythm, normal heart sounds and intact distal pulses.  Bradycardia present.  Exam reveals no gallop and no friction rub.   No murmur heard. Slight bradycardia, HR53-60 during exam  Pulmonary/Chest: Effort normal and breath sounds normal. No respiratory distress. He has no decreased breath sounds. He has no wheezes. He has no rhonchi. He has no rales.  Abdominal: Soft. Normal appearance and bowel sounds are normal. He exhibits no distension. There is tenderness (L lateral abd wall along midaxillary line). There is no rigidity, no rebound, no guarding, no CVA tenderness, no tenderness at McBurney's point and negative Murphy's sign.    Soft, nondistended, +BS throughout, with tenderness along L lateral abdomen wall in midaxillary line, no other abdominal tenderness, no r/g/r, neg murphy's, neg mcburney's, no CVA TTP   Musculoskeletal: Normal range of motion.       Thoracic back: He exhibits tenderness and spasm. He exhibits normal range of motion, no bony tenderness and no deformity.       Back:  Thoracic spine with FROM intact without spinous process TTP, no bony stepoffs or deformities, with mild L sided paraspinous muscle TTP and slight palpable muscle spasm. Strength 5/5 in all extremities, sensation grossly intact in all extremities, gait steady and nonantalgic. No overlying skin changes. Distal pulses intact  Neurological: He is alert  and oriented to person, place, and time. He has normal strength. No sensory deficit. Gait normal.  Skin: Skin is warm, dry and intact. No rash noted.  Psychiatric: He has a normal mood and affect.  Nursing note and vitals reviewed.   ED Course  Procedures (including critical care time) Labs Review Labs Reviewed  URINALYSIS, ROUTINE W REFLEX MICROSCOPIC (NOT AT [facility]) - Abnormal; Notable for the following:    Color, Urine Dolce (*)    APPearance TURBID (*)    Hgb urine dipstick LARGE (*)    Bilirubin Urine MODERATE (*)    Ketones, ur 15 (*)    Protein, ur 100 (*)    Leukocytes, UA MODERATE (*)    All other components within normal limits  CBC WITH DIFFERENTIAL/PLATELET - Abnormal; Notable for the following:    WBC 14.3 (*)    Neutro Abs 11.0 (*)    All other components within normal limits  COMPREHENSIVE METABOLIC PANEL - Abnormal; Notable for the following:    Potassium 6.0 (*)    Anion gap 4 (*)    All other components within normal limits  LIPASE, BLOOD - Abnormal; Notable for the following:    Lipase 17 (*)    All other components within normal limits  URINE MICROSCOPIC-ADD ON - Abnormal; Notable for the following:    Bacteria, UA FEW (*)    All other components within normal limits  URINE CULTURE  CK  POTASSIUM  I-STAT TROPOININ, ED    Imaging Review Ct Renal Stone Study  04/29/2015   CLINICAL DATA:  Left flank pain, hematuria  EXAM: CT ABDOMEN AND PELVIS WITHOUT CONTRAST  TECHNIQUE: Multidetector CT imaging of the abdomen and pelvis was performed following the standard protocol without IV contrast.  COMPARISON:  CT abdomen pelvis dated 05/05/2004.  FINDINGS: Evaluation of the solid organs is limited by the lack of intravascular contrast. Again noted is sequela of chronic pancreatitis with associated dystrophic calcifications throughout the pancreas and commensurate pancreatic duct dilatation. Overall appearance of the pancreas is grossly stable. Liver, spleen,  gallbladder, and adrenal glands are unremarkable.  There is a punctate nonobstructing right renal stone. There is a 4 x 3 mm oval calcification in the left lower pelvis which is of uncertain relationship to the distal left ureter. There is, however, a mild left-sided hydronephrosis suggesting that this is an intraureteral stone  The bowel is grossly normal in caliber and configuration throughout. Moderate amount of stool and gas throughout the colon. Appendix is not convincingly seen but there are no inflammatory changes about the cecum to suggest acute appendicitis. No free fluid or abscess collection identified. No free intraperitoneal air. No enlarged lymph nodes seen. Abdominal aorta is normal in caliber.  There is a partially calcified solid-appearing mass within the left lower lung (left lower lobe) measuring 2.3 x 1.9 cm (axial image 7 and coronal image 62). Lung bases otherwise clear. Mild degenerative change noted within the lumbar spine but no acute osseous abnormality.  IMPRESSION: 1. 4 mm calcification in the left lower pelvis is of uncertain relationship to the distal left ureter but is suspected to be an intraureteral stone given the mild left-sided hydronephrosis. 2. Punctate nonobstructing right renal stone. 3. Sequela of chronic pancreatitis which appears grossly stable compared to a previous CT. 4. Solid-appearing partially-calcified mass at the left lung base, within the left lower lobe, measuring 2.3 x 1.9 cm. The central calcification within the mass may indicate benign granuloma, however, the size of the mass is suspicious for a neoplastic process. Recommend a dedicated complete chest CT for more detailed characterization of this left lower lobe mass and to exclude other possible pulmonary masses/nodules.   Electronically Signed   By: Bary Richard M.D.   On: 04/29/2015 14:22     EKG Interpretation None      MDM   Final diagnoses:  Flank pain  Left-sided thoracic back pain    Hematuria  Bradycardia  Hyperkalemia  Leukocytosis  Lung mass  Nephrolithiasis    59 y.o. male here with L lateral abdomen pain that started after he helped lift his mother onto the toilet. Denies abdominal pain, just states the side of his abdominal wall hurts. On exam, tenderness to lateral abd wall along midaxillary line over the musculature, no anterior abd tenderness, nonperitoneal. Back with no midline tenderness, some mild paraspinous muscle TTP in L thoracic area. No urinary complaints but given location of pain, will obtain U/A to eval for UTI/hematuria. Doubt nephrolithiasis, doubt need for labs. No red flag s/sx for back pain, pt ambulatory and neurovascularly intact in all extremities, doubt need for imaging. Will give pain meds and reassess.  12:34 PM Nursing reporting that pt's urine is coke colored. Given this finding, will obtain labs including CK, will give fluids. Will hold on imaging until U/A results. Will reassess shortly.   1:17 PM CBC w/diff showing leukocytosis at 14.3, CMP with K 6.0  will get EKG. Lipase WNL. CK WNL. U/A with large Hgb, TNTC RBC, few bacteria, 3-6 WBC. Difficulty to determine etiology, and given Hgb in urine, and leukocytosis, will obtain CT to eval for pyelo vs stone.  1:45 PM EKG with slight ST elevation in V2 but no other ST changes, no inverted Ts, no peaked Ts, LVH with anterior Qs, sinus brady. No prior EKG to compare. Given these odd findings, will obtain troponin. Will also repeat K level to confirm hyperkalemia on CMP. Pt currently getting CT. Will reassess shortly.   2:10 PM Nursing reporting that pt has some increase in pain. Will give low dose morphine. CT still pending. Labs being redrawn now. HR 60s currently. Will monitor and reassess.  2:56 PM Repeat K is WNL at 4.5. Trop neg. CT renal study showing 4mm calcification in L lower pelvis which is unclear if it's intrauretal but believed to be since there is mild L hydronephrosis.  Secondarily there is a solid partially calcified mass in L lung base 2.3 x 1.9 cm, recommendation for chest CT to further evaluate given possible concern for neoplasm. Pt uninsured and without primary care provider. Discussed that he could follow up with [facility] and get CT as an outpt, or proceed with CT here. He chose to stay for the CT. Will order this now and monitor. Pain improved at this time. Will reassess shortly.   3:50 PM  Awaiting CT chest results. Will sign over care to Fayrene Helper PA-C at shift change. Please see his notes for further documentation of care and dispo.  Giovonnie Trettel Camprubi-Soms, PA-C 04/29/15 1551  Blane Ohara, MD 05/02/15 [phone]

## 2015-04-29 NOTE — Discharge Instructions (Signed)

## 2015-05-01 LAB — URINE CULTURE

## 2016-07-28 IMAGING — CT CT CHEST W/ CM
2 of 3 series · 15 of 36 positions shown, 18 images · IV contrast (Omni 300)
Comparison: CT Abdomen and Pelvis 7301 hours today. CT Abdomen and
Pelvis 05/05/2004.

CLINICAL DATA: 58-year-old male with left lower lung mass on CT
Abdomen and Pelvis today. Initial encounter.

EXAM:
CT CHEST WITH CONTRAST
TECHNIQUE: Multidetector CT imaging of the chest was performed during
intravenous contrast administration.
CONTRAST:  100mL OMNIPAQUE IOHEXOL 300 MG/ML  SOLN

[Series 2: thorax 5.0 i31f 1 · axial · 0.69mm/px · z∈[-43,+222]mm · 12 of 63 slices shown, 15 images]
[im 5/63  mediastinal]
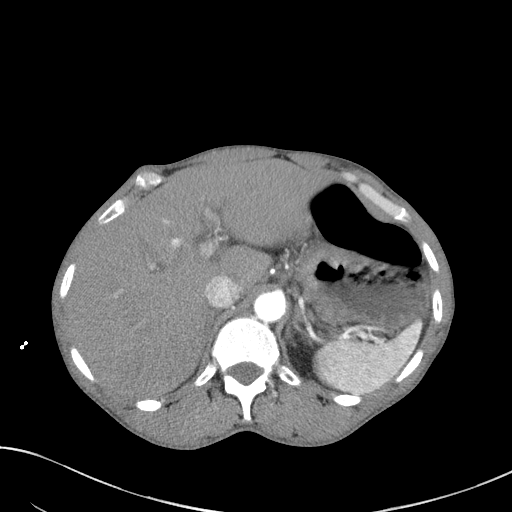
[im 5/63  lung]
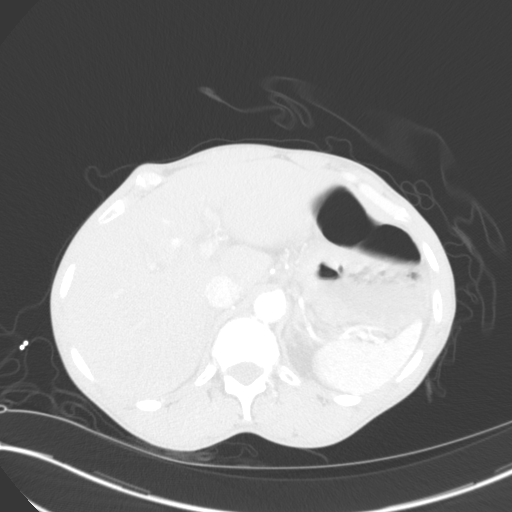
[im 10/63  lung]
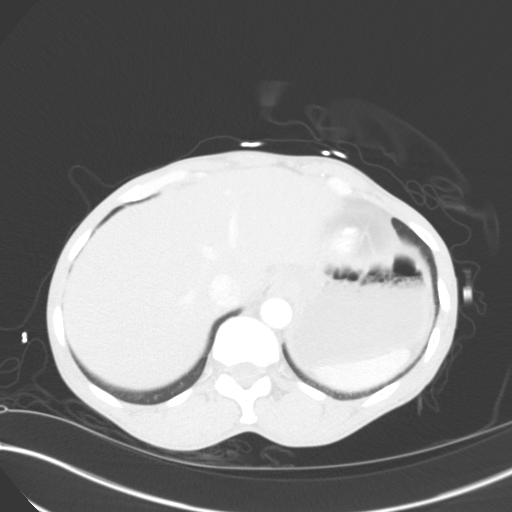
[im 14/63  lung]
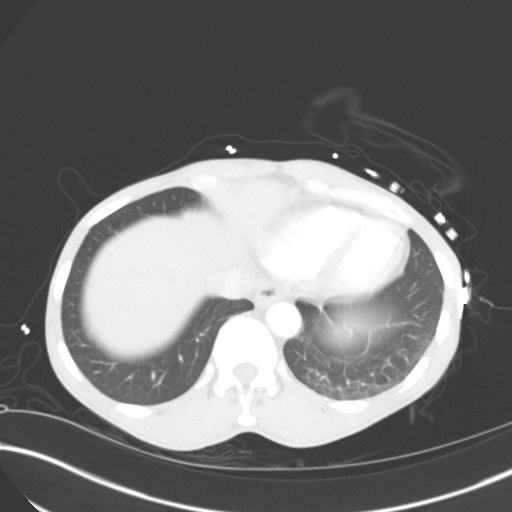
[im 19/63  lung]
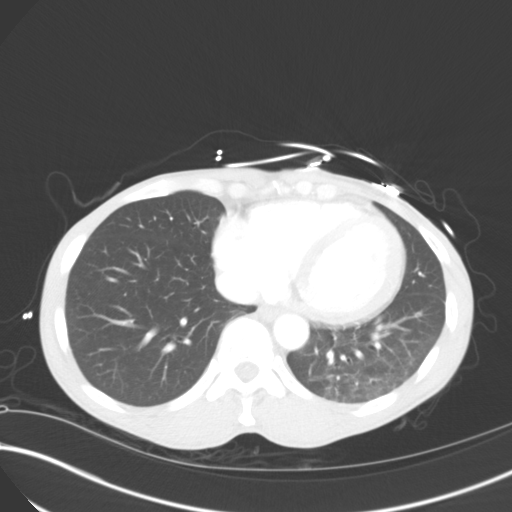
[im 23/63  mediastinal]
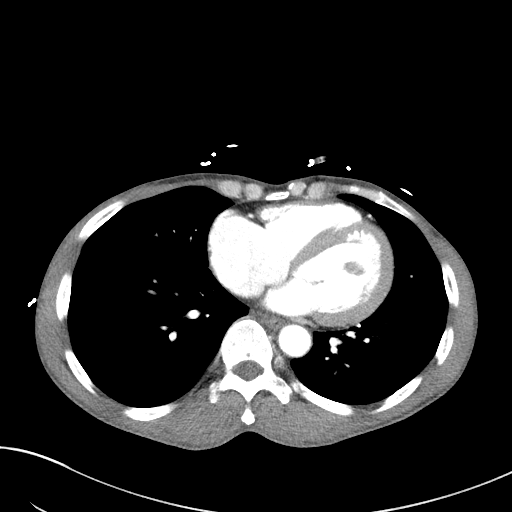
[im 23/63  lung]
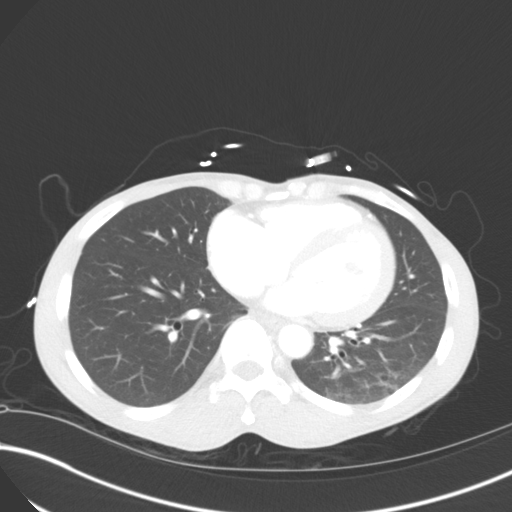
[im 28/63  lung]
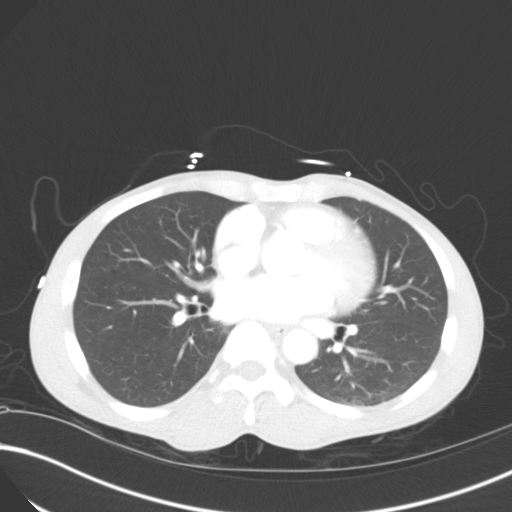
[im 35/63  lung]
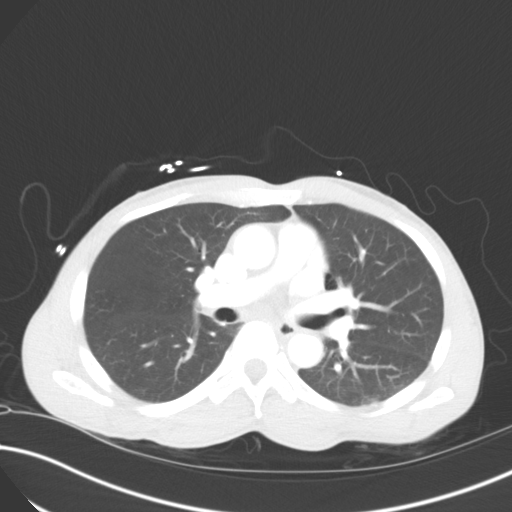
[im 40/63  lung]
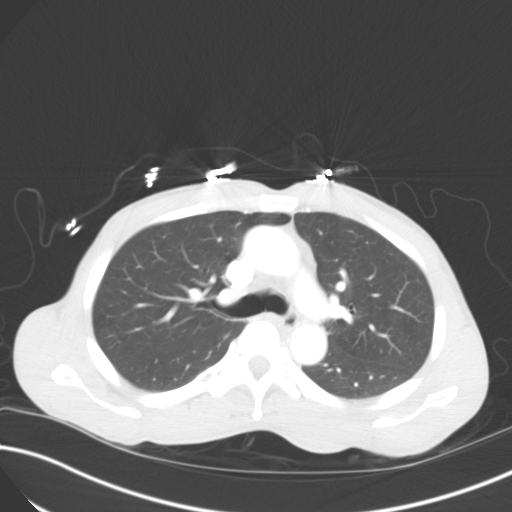
[im 44/63  mediastinal]
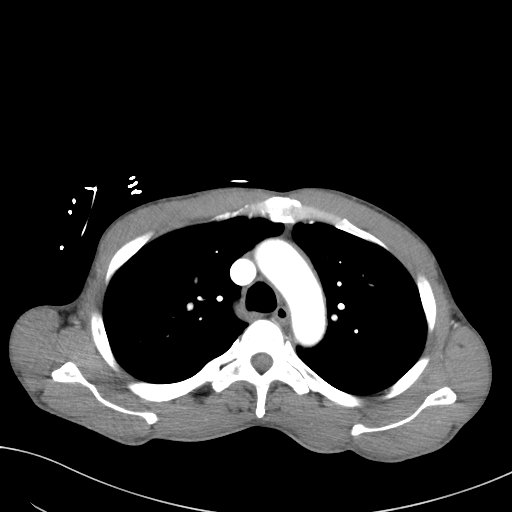
[im 44/63  lung]
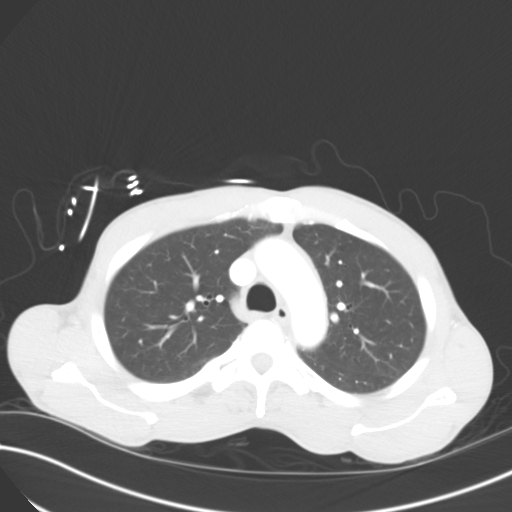
[im 49/63  lung]
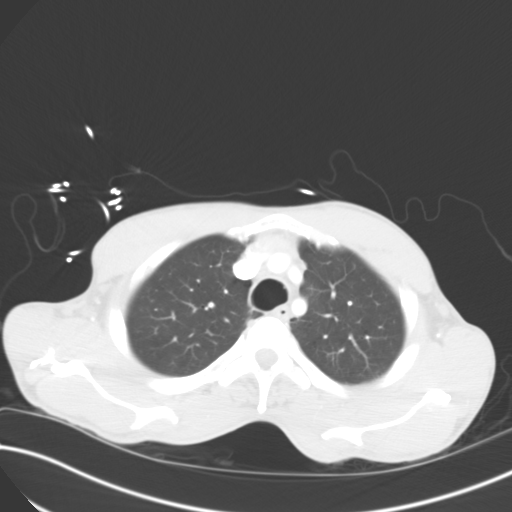
[im 53/63  lung]
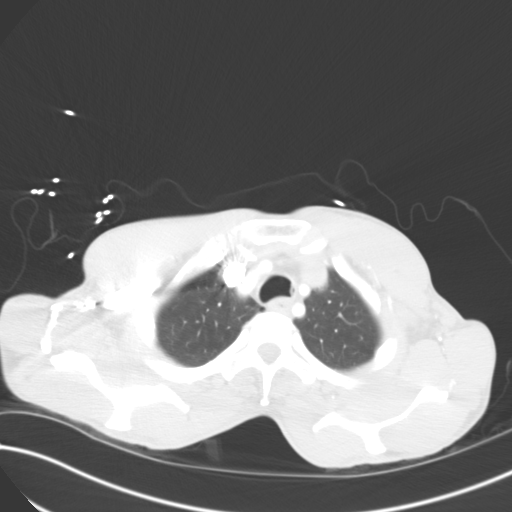
[im 58/63  lung]
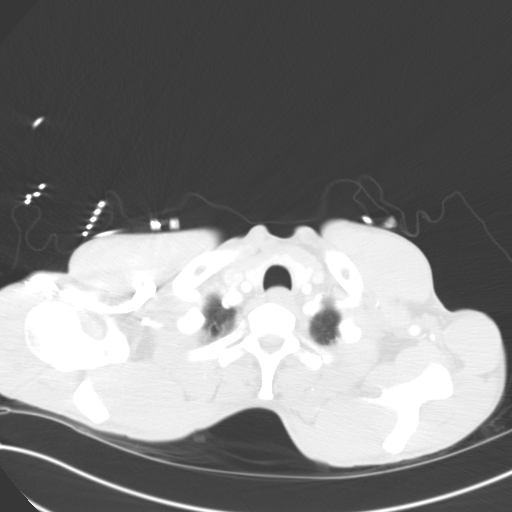

[Series 8: coronal · coronal · 0.56mm/px · 3 of 62 slices shown]
[im 13/62  lung]
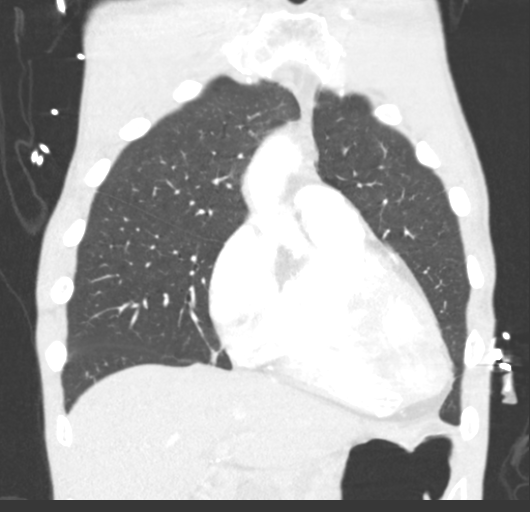
[im 25/62  lung]
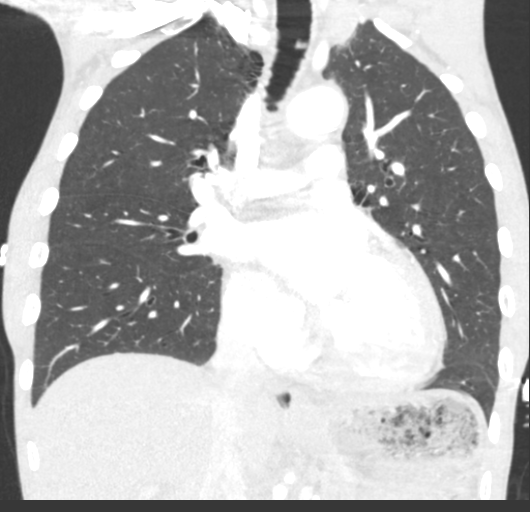
[im 37/62  lung]
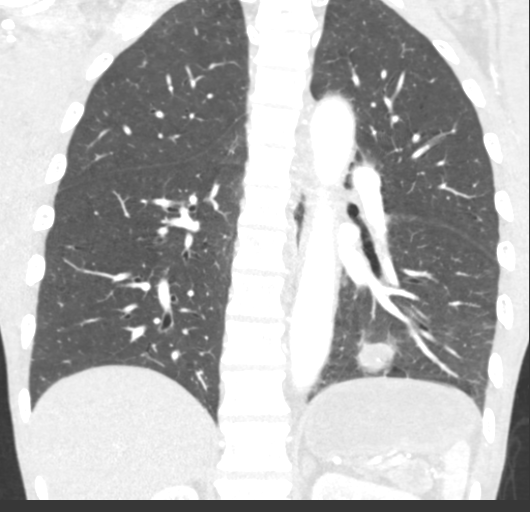

[15 of 36 positions shown; findings below may reference images not displayed]

FINDINGS: Negative thoracic inlet. Mild cardiomegaly. No pericardial effusion.
Minimal calcified plaque at the left subclavian artery origin,
otherwise negative visualized aorta. Other major mediastinal
vascular structures appear within normal limits. No mediastinal or
hilar lymphadenopathy.

No axillary lymphadenopathy. Stable visualized upper abdominal
viscera, including dilated main pancreatic duct and pancreatic tail
atrophy. Mild periportal edema suggested.

No osseous abnormality identified.

Minimal retained secretions along the left wall of the upper trachea
(series 3, image 11). Major airways otherwise are patent. The right
lung is negative. No pleural effusion.

There is mild mostly dependent opacity in the left lower lobe
compatible. There is a round mildly lobulated left lower lobe
anterior basal segment lung nodule measuring 18 x 24 x 17 mm (AP by
transverse by CC). This has a course 7 mm calcification which is
slightly off center. Elsewhere the lesion is somewhat low density
(20-30 Hounsfield units). This is identified in 7227 (series 2,
image 6 at that time) measuring up to 14 mm. The left lung otherwise
is negative.
IMPRESSION: 1. Left lower lobe lung nodule is chronic and most compatible with a
benign pulmonary hamartoma, which has been present for at least 11
years and mildly enlarged over that time span.
2. Mild patchy left lower lobe opacity elsewhere might reflect
scarring or atelectasis. Trace retained secretions in the trachea.
3. No other acute findings in the chest.
4. Stable visualized upper abdomen including pancreatic atrophy with
pancreatic ductal dilatation, and suggestion of mild periportal
edema.

## 2016-07-28 IMAGING — CT CT RENAL STONE PROTOCOL
2 of 4 series · 5 of 46 positions shown, 7 images · non-contrast
Comparison: CT abdomen pelvis dated 05/05/2004.

CLINICAL DATA: Left flank pain, hematuria

EXAM:
CT ABDOMEN AND PELVIS WITHOUT CONTRAST
TECHNIQUE: Multidetector CT imaging of the abdomen and pelvis was performed
following the standard protocol without IV contrast.

[Series 204: cor · coronal · 0.45mm/px · 4 of 109 slices shown, 5 images]
[im 25/109  soft-tissue]
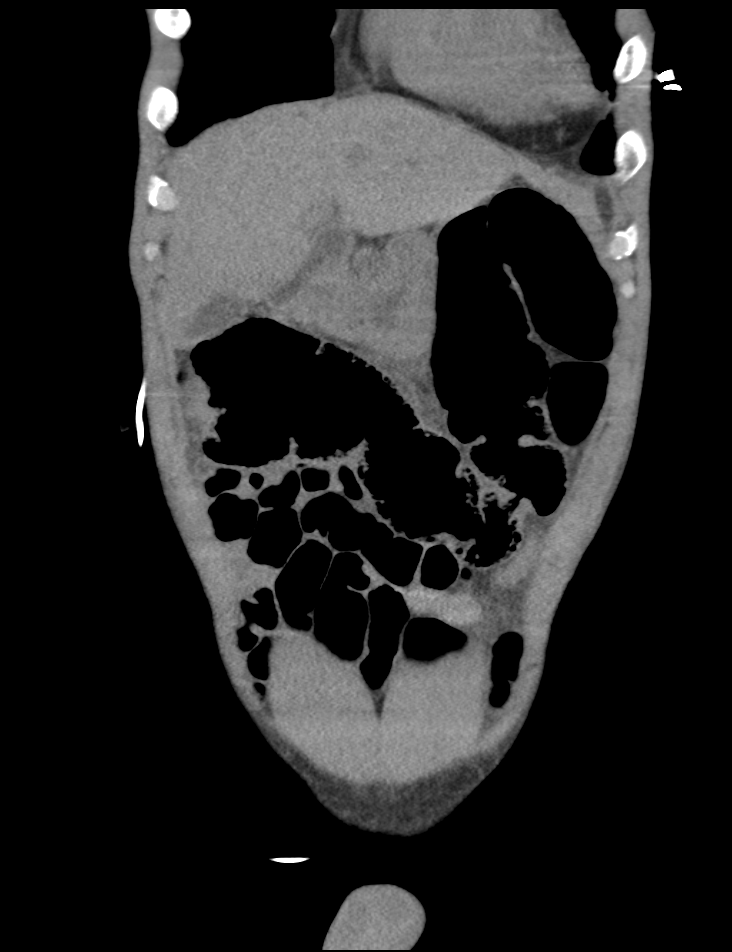
[im 25/109  bone]
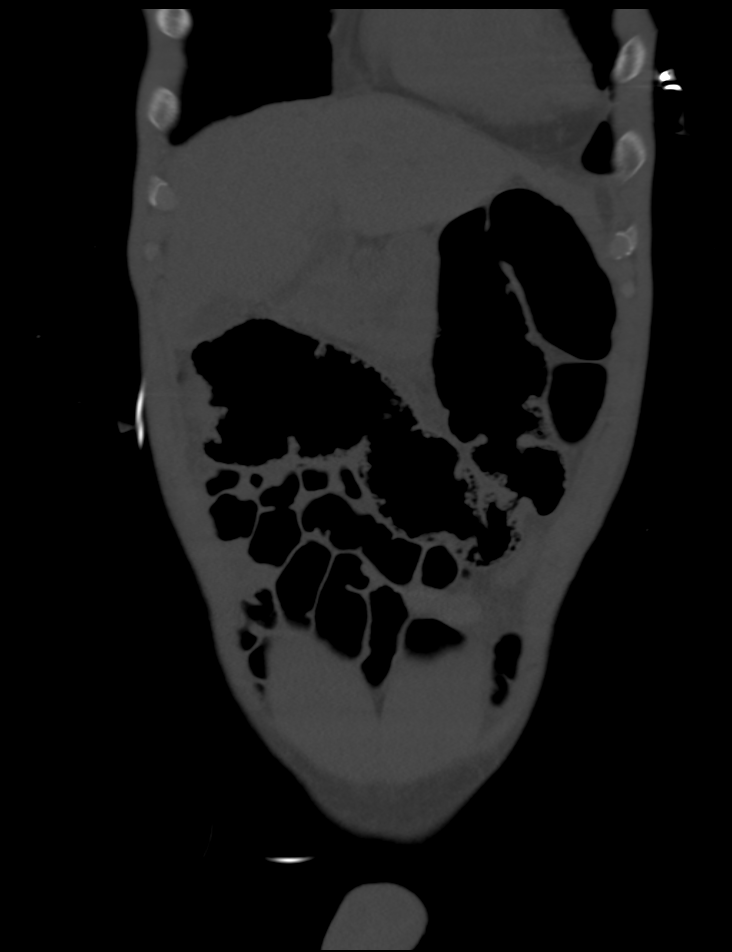
[im 49/109  soft-tissue]
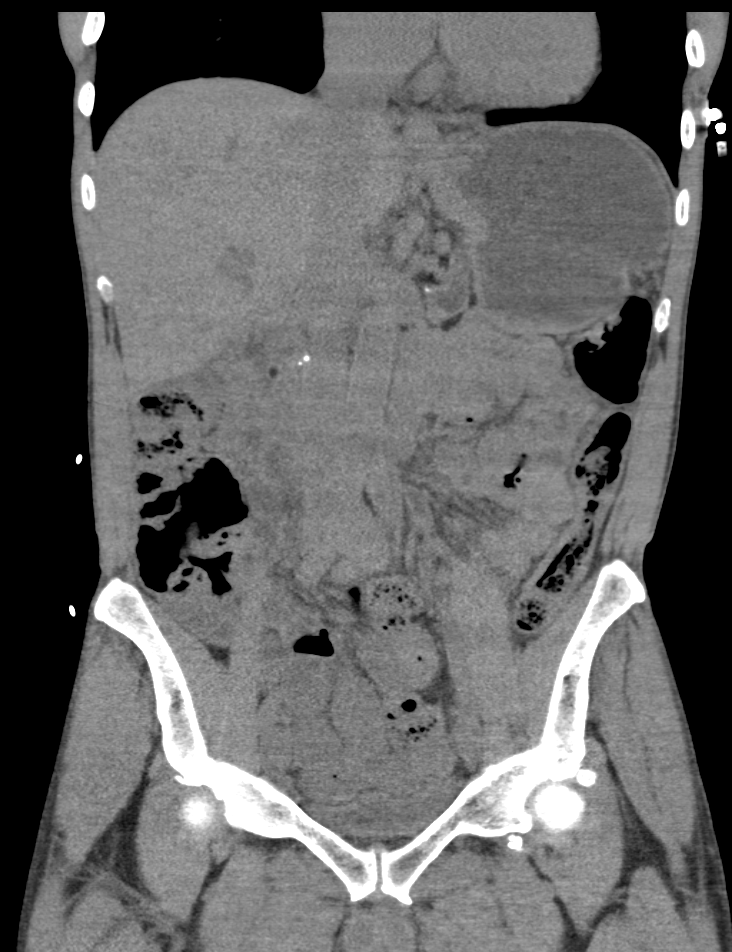
[im 73/109  soft-tissue]
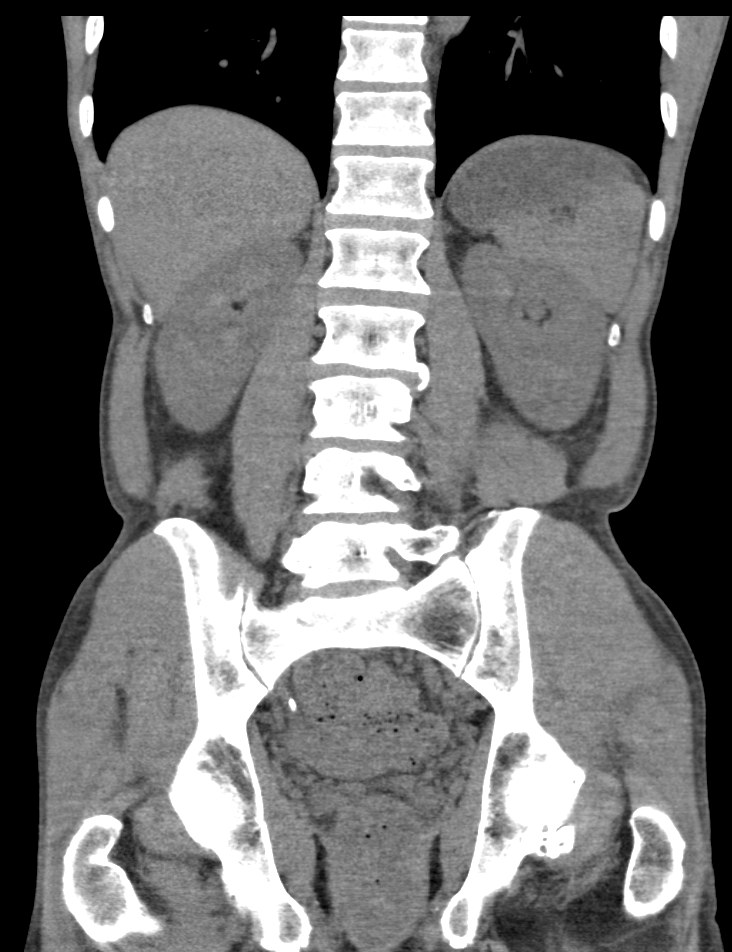
[im 97/109  soft-tissue]
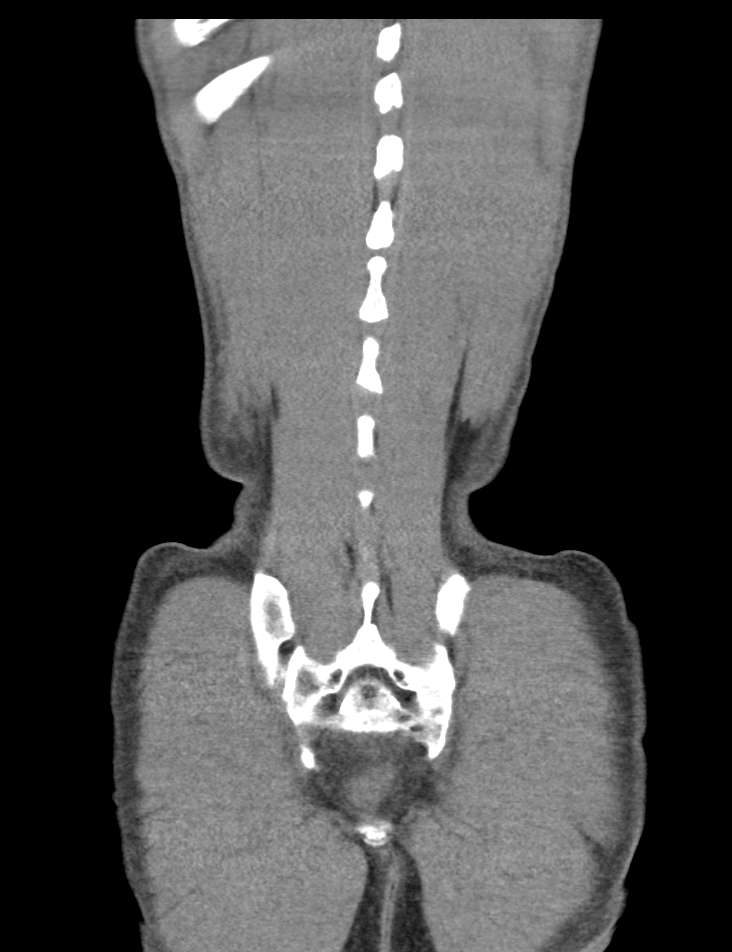

[Series 205: sag · sagittal · 0.45mm/px · 1 of 148 slices shown, 2 images]
[im 50/148  soft-tissue]
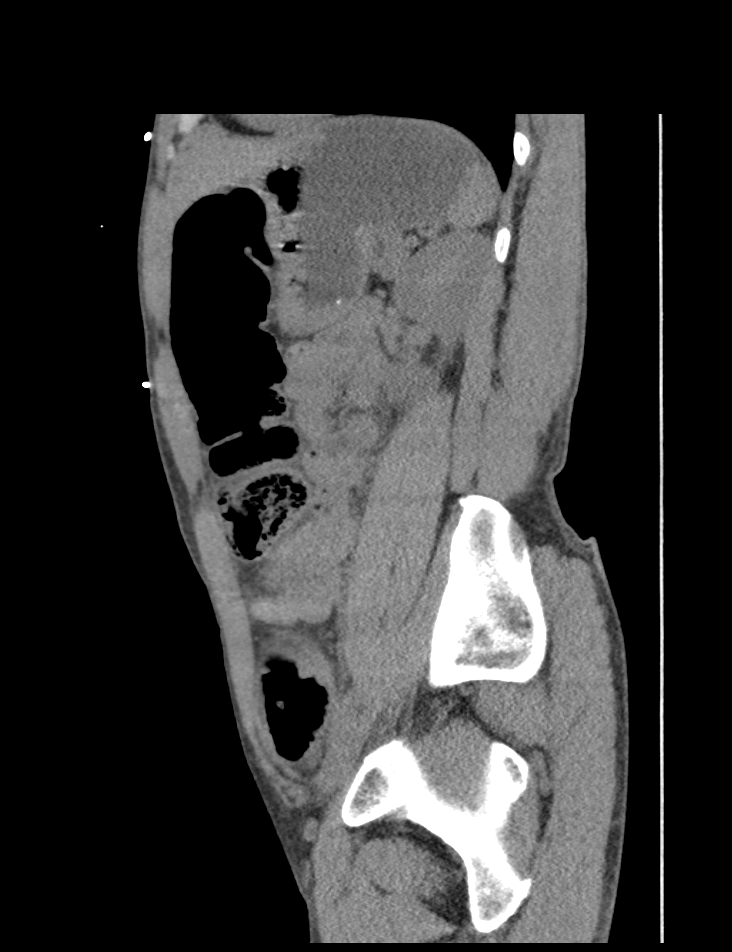
[im 50/148  bone]
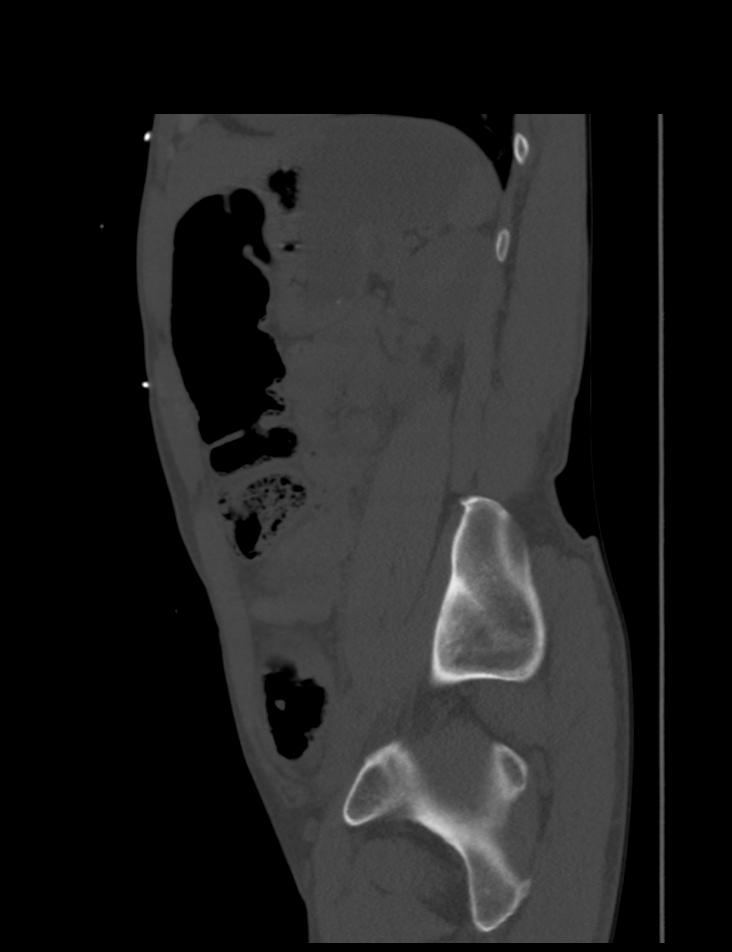

[5 of 46 positions shown; findings below may reference images not displayed]

FINDINGS: Evaluation of the solid organs is limited by the lack of
intravascular contrast. Again noted is sequela of chronic
pancreatitis with associated dystrophic calcifications throughout
the pancreas and commensurate pancreatic duct dilatation. Overall
appearance of the pancreas is grossly stable. Liver, spleen,
gallbladder, and adrenal glands are unremarkable.

There is a punctate nonobstructing right renal stone. There is a 4 x
3 mm oval calcification in the left lower pelvis which is of
uncertain relationship to the distal left ureter. There is, however,
a mild left-sided hydronephrosis suggesting that this is an
intraureteral stone

The bowel is grossly normal in caliber and configuration throughout.
Moderate amount of stool and gas throughout the colon. Appendix is
not convincingly seen but there are no inflammatory changes about
the cecum to suggest acute appendicitis. No free fluid or abscess
collection identified. No free intraperitoneal air. No enlarged
lymph nodes seen. Abdominal aorta is normal in caliber.

There is a partially calcified solid-appearing mass within the left
lower lung (left lower lobe) measuring 2.3 x 1.9 cm (axial image 7
and coronal image 62). Lung bases otherwise clear. Mild degenerative
change noted within the lumbar spine but no acute osseous
abnormality.
IMPRESSION: 1. 4 mm calcification in the left lower pelvis is of uncertain
relationship to the distal left ureter but is suspected to be an
intraureteral stone given the mild left-sided hydronephrosis.
2. Punctate nonobstructing right renal stone.
3. Sequela of chronic pancreatitis which appears grossly stable
compared to a previous CT.
4. Solid-appearing partially-calcified mass at the left lung base,
within the left lower lobe, measuring 2.3 x 1.9 cm. The central
calcification within the mass may indicate benign granuloma,
however, the size of the mass is suspicious for a neoplastic
process. Recommend a dedicated complete chest CT for more detailed
characterization of this left lower lobe mass and to exclude other
possible pulmonary masses/nodules.

## 2016-08-12 ENCOUNTER — Ambulatory Visit: Payer: Self-pay

## 2016-08-12 ENCOUNTER — Encounter (HOSPITAL_COMMUNITY): Payer: Self-pay | Admitting: Emergency Medicine

## 2016-08-12 ENCOUNTER — Encounter (HOSPITAL_COMMUNITY): Payer: Self-pay

## 2016-08-12 ENCOUNTER — Emergency Department (HOSPITAL_COMMUNITY)
Admission: EM | Admit: 2016-08-12 | Discharge: 2016-08-12 | Disposition: A | Discharge status: Home / Self Care | Payer: Non-veteran care | Attending: Emergency Medicine | Admitting: Emergency Medicine

## 2016-08-12 ENCOUNTER — Ambulatory Visit (HOSPITAL_COMMUNITY)
Admission: EM | Admit: 2016-08-12 | Discharge: 2016-08-12 | Disposition: A | Discharge status: Nursing Home (Includes ICF) | Payer: Non-veteran care | Attending: Family Medicine | Admitting: Family Medicine

## 2016-08-12 DIAGNOSIS — M5410 Radiculopathy, site unspecified: Secondary | ICD-10-CM

## 2016-08-12 DIAGNOSIS — F17210 Nicotine dependence, cigarettes, uncomplicated: Secondary | ICD-10-CM | POA: Insufficient documentation

## 2016-08-12 DIAGNOSIS — I10 Essential (primary) hypertension: Secondary | ICD-10-CM | POA: Insufficient documentation

## 2016-08-12 DIAGNOSIS — M79605 Pain in left leg: Secondary | ICD-10-CM | POA: Diagnosis not present

## 2016-08-12 DIAGNOSIS — M545 Low back pain: Secondary | ICD-10-CM | POA: Diagnosis present

## 2016-08-12 DIAGNOSIS — M5416 Radiculopathy, lumbar region: Secondary | ICD-10-CM | POA: Insufficient documentation

## 2016-08-12 HISTORY — DX: Essential (primary) hypertension: I10

## 2016-08-12 MED ORDER — IBUPROFEN 600 MG PO TABS: 600.0000 mg | ORAL_TABLET | Freq: Three times a day (TID) | ORAL | 0 refills | Status: DC

## 2016-08-12 MED ORDER — IBUPROFEN 400 MG PO TABS
600.0000 mg | ORAL_TABLET | Freq: Once | ORAL | Status: AC
  Administered 2016-08-12: 600 mg via ORAL
  Filled 2016-08-12: qty 1

## 2016-08-12 MED ORDER — METHOCARBAMOL 500 MG PO TABS
1000.0000 mg | ORAL_TABLET | Freq: Once | ORAL | Status: AC
  Administered 2016-08-12: 1000 mg via ORAL
  Filled 2016-08-12: qty 2

## 2016-08-12 MED ORDER — METHOCARBAMOL 500 MG PO TABS: 500.0000 mg | ORAL_TABLET | Freq: Every evening | ORAL | 0 refills | Status: DC | PRN

## 2016-08-12 MED ORDER — DEXAMETHASONE SODIUM PHOSPHATE 10 MG/ML IJ SOLN
10.0000 mg | Freq: Once | INTRAMUSCULAR | Status: AC
  Administered 2016-08-12: 10 mg via INTRAMUSCULAR
  Filled 2016-08-12: qty 1

## 2016-08-12 NOTE — ED Triage Notes (Signed)
Pt presents for evaluation of ongoing L leg pain/spasms to posterior leg x2 weeks. Pt. States he is on feet for long hours at job and bikes long distances regularly. Pt. Denies injury to leg. Hx HTN. Pt. Able to ambulate on leg, AxO x4.

## 2016-08-12 NOTE — ED Provider Notes (Signed)
[facility]    CSN: [id number] Arrival date & time: 08/12/16  1618     History   Chief Complaint Chief Complaint  Patient presents with  . Leg Pain    HPI Elijah Werner is a 60 y.o. male.   The history is provided by the patient and the spouse.  Leg Pain  Location:  Leg Time since incident:  1 week Injury: no   Leg location:  L upper leg Pain details:    Quality:  Shooting and sharp   Radiates to:  Does not radiate   Severity:  Moderate   Progression:  Worsening Chronicity:  New Dislocation: no   Prior injury to area:  No Relieved by:  Movement Associated symptoms: back pain, decreased ROM and numbness     Past Medical History:  Diagnosis Date  . Pancreatitis     There are no active problems to display for this patient.   History reviewed. No pertinent surgical history.     Home Medications    Prior to Admission medications   Medication Sig Start Date End Date Taking? Authorizing Provider  HYDROcodone-acetaminophen (NORCO/VICODIN) 5-325 MG per tablet Take 1 tablet by mouth every 6 (six) hours as needed for moderate pain. 04/29/15   Fayrene HelperBowie Tran, PA-C  Multiple Vitamins-Minerals (MULTIVITAMIN WITH MINERALS) tablet Take 1 tablet by mouth daily.    Historical Provider, MD  tamsulosin (FLOMAX) 0.4 MG CAPS capsule Take 1 capsule (0.4 mg total) by mouth daily. 04/29/15   Fayrene HelperBowie Tran, PA-C    Family History History reviewed. No pertinent family history.  Social History Social History  Substance Use Topics  . Smoking status: Current Every Day Smoker    Packs/day: 0.50    Types: Cigarettes  . Smokeless tobacco: Never Used  . Alcohol use No     Allergies   Review of patient's allergies indicates no known allergies.   Review of Systems Review of Systems  Constitutional: Negative.   Gastrointestinal: Negative.   Genitourinary: Negative.   Musculoskeletal: Positive for back pain and gait problem.  All other systems reviewed and are  negative.    Physical Exam Triage Vital Signs ED Triage Vitals  Enc Vitals Group     BP 08/12/16 1648 158/76     Pulse Rate 08/12/16 1648 112     Resp 08/12/16 1648 14     Temp 08/12/16 1648 98.7 F (37.1 C)     Temp Source 08/12/16 1648 Oral     SpO2 08/12/16 1648 99 %     Weight --      Height --      Head Circumference --      Peak Flow --      Pain Score 08/12/16 1708 6     Pain Loc --      Pain Edu? --      Excl. in GC? --    No data found.   Updated Vital Signs BP 158/76 (BP Location: Left Arm)   Pulse 112   Temp 98.7 F (37.1 C) (Oral)   Resp 14   SpO2 99%   Visual Acuity Right Eye Distance:   Left Eye Distance:   Bilateral Distance:    Right Eye Near:   Left Eye Near:    Bilateral Near:     Physical Exam  Constitutional: He is oriented to person, place, and time. He appears well-developed and well-nourished. He appears distressed.  Musculoskeletal: He exhibits tenderness.       Left  hip: He exhibits decreased range of motion, decreased strength and tenderness. He exhibits no swelling and no deformity.       Legs: Neurological: He is alert and oriented to person, place, and time.  Skin: Skin is warm and dry.  Nursing note and vitals reviewed.    UC Treatments / Results  Labs (all labs ordered are listed, but only abnormal results are displayed) Labs Reviewed - No data to display  EKG  EKG Interpretation None       Radiology No results found.  Procedures Procedures (including critical care time)  Medications Ordered in UC Medications - No data to display   Initial Impression / Assessment and Plan / UC Course  I have reviewed the triage vital signs and the nursing notes.  Pertinent labs & imaging results that were available during my care of the patient were reviewed by me and considered in my medical decision making (see chart for details).  Clinical Course    Sent for eval of back and left leg radicular pain.  Final  Clinical Impressions(s) / UC Diagnoses   Final diagnoses:  None    New Prescriptions New Prescriptions   No medications on file     Linna HoffJames D Kindl, MD 08/12/16 1746

## 2016-08-12 NOTE — Discharge Instructions (Signed)
Take Ibuprofen 600mg  three times daily for the next week Take muscle relaxer at bedtime to help you sleep Please follow up with orthopedics if your symptoms are not getting better

## 2016-08-12 NOTE — ED Triage Notes (Signed)
Pt c/o constant LLE pain onset 2 weeks  Denies inj/trauma.... 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   Slow gait... A&O x4... NAD

## 2016-08-12 NOTE — ED Provider Notes (Signed)
[facility]   CSN: [id number] Arrival date & time: 08/12/16  1804  By signing my name below, I, Soijett Blue, attest that this documentation has been prepared under the direction and in the presence of Bethel BornKelly Marie Willowdean Luhmann, PA-C Electronically Signed: Soijett Blue, ED Scribe. 08/12/16. 7:40 PM.   History   Chief Complaint Chief Complaint  Patient presents with  . Leg Pain    HPI Elijah Werner is a 60 y.o. male with a PMHx of HTN, who presents to the Emergency Department complaining of left leg pain onset 2 weeks. Pt states that he stands and walks for prolonged periods of time while at his job which aggravates his symptoms. Pt denies recent injury or trauma to his left leg. Pt reports that he was seen at Urgent Care today PTA to the ED and was sent to the ED for further evaluation of his symptoms. Pt is having associated symptoms of left lower back pain and gait problem due to pain. Pt states that his left lower back pain radiates to his left leg. Pt reports that his left lower back pain is worsened with leaning back and ambulation. Leaning forward makes it better. He notes that he has not tried any medications for the relief of his symptoms. He denies color change, wound, rash, weakness, swelling, bowel/bladder incontinence, and any other symptoms. Denies hx of CA or IV drug use. Denies issues with kidneys.   Per pt chart review: Pt was seen at Urgent Care earlier today on 08/12/2016 for left leg pain and back pain. Pt was not prescribed any medications for the relief of his symptoms. Pt was advised by the provider at the Urgent Care to come into the ED for further evaluation of his symptoms.    The history is provided by the patient. No language interpreter was used.    Past Medical History:  Diagnosis Date  . Hypertension   . Pancreatitis     There are no active problems to display for this patient.   History reviewed. No pertinent surgical history.     Home  Medications    Prior to Admission medications   Medication Sig Start Date End Date Taking? Authorizing Provider  HYDROcodone-acetaminophen (NORCO/VICODIN) 5-325 MG per tablet Take 1 tablet by mouth every 6 (six) hours as needed for moderate pain. 04/29/15   Fayrene HelperBowie Tran, PA-C  Multiple Vitamins-Minerals (MULTIVITAMIN WITH MINERALS) tablet Take 1 tablet by mouth daily.    Historical Provider, MD  tamsulosin (FLOMAX) 0.4 MG CAPS capsule Take 1 capsule (0.4 mg total) by mouth daily. 04/29/15   Fayrene HelperBowie Tran, PA-C    Family History No family history on file.  Social History Social History  Substance Use Topics  . Smoking status: Current Every Day Smoker    Packs/day: 0.50    Types: Cigarettes  . Smokeless tobacco: Never Used  . Alcohol use No     Allergies   Review of patient's allergies indicates no known allergies.   Review of Systems Review of Systems  Constitutional: Negative for fever.  Gastrointestinal:       No bowel incontinence.  Genitourinary:       No bladder incontinence.  Musculoskeletal: Positive for arthralgias (left leg), back pain (left lower) and gait problem (due to pain). Negative for joint swelling.  Skin: Negative for color change, rash and wound.  Neurological: Negative for weakness.     Physical Exam Updated Vital Signs BP 152/99 (BP Location: Right Arm)   Pulse Marland Kitchen(!)  56   Temp 98.1 F (36.7 C) (Oral)   Resp 18   SpO2 100%   Physical Exam  Constitutional: He is oriented to person, place, and time. He appears well-developed and well-nourished. No distress.  HENT:  Head: Normocephalic and atraumatic.  Eyes: EOM are normal.  Neck: Neck supple.  Cardiovascular: Normal rate.   Pulmonary/Chest: Effort normal. No respiratory distress.  Abdominal: He exhibits no distension.  Musculoskeletal: Normal range of motion.       Lumbar back: He exhibits tenderness. He exhibits no bony tenderness.  Back: Inspection: No masses, deformity, or rash Palpation:  Tenderness over sacroiliac joint. No lumbar spinal tenderness ROM: Able to lean forwards without pain, but not backwards. Strength: 5/5 in lower extremities and normal plantar and dorsiflexion Sensation: Intact sensation with light touch in lower extremities bilaterally Gait: Antalgic gait SLR: Negative seated straight leg raise    Neurological: He is alert and oriented to person, place, and time.  Skin: Skin is warm and dry.  Psychiatric: He has a normal mood and affect. His behavior is normal.  Nursing note and vitals reviewed.    ED Treatments / Results  DIAGNOSTIC STUDIES: Oxygen Saturation is 100% on RA, nl by my interpretation.    COORDINATION OF CARE: 7:37 PM Discussed treatment plan with pt at bedside which includes robaxin, ibuprofen, decadron, and pt agreed to plan.  Procedures Procedures (including critical care time)  Medications Ordered in ED Medications  ibuprofen (ADVIL,MOTRIN) tablet 600 mg (600 mg Oral Given 08/12/16 1949)  methocarbamol (ROBAXIN) tablet 1,000 mg (1,000 mg Oral Given 08/12/16 1949)  dexamethasone (DECADRON) injection 10 mg (10 mg Intramuscular Given 08/12/16 1945)     Initial Impression / Assessment and Plan / ED Course  I have reviewed the triage vital signs and the nursing notes.   Clinical Course   60 year old male with radicular pain possible from SI joint vs spinal stenosis. Imaging not indicated as patient has not had an injury and has no red flag signs/symptoms. He is ambulatory. Steroid, muscle relaxer, NSAIDs given in ED. Advised course of NSAIDs and muscle relaxer. Ortho follow up recommended for further evaluation.   Final Clinical Impressions(s) / ED Diagnoses   Final diagnoses:  Radicular pain of left lower extremity    New Prescriptions Discharge Medication List as of 08/12/2016  8:12 PM    START taking these medications   Details  ibuprofen (ADVIL,MOTRIN) 600 MG tablet Take 1 tablet (600 mg total) by mouth 3 (three)  times daily., Starting Fri 08/12/2016, Print    methocarbamol (ROBAXIN) 500 MG tablet Take 1 tablet (500 mg total) by mouth at bedtime and may repeat dose one time if needed., Starting Fri 08/12/2016, Print       I personally performed the services described in this documentation, which was scribed in my presence. The recorded information has been reviewed and is accurate.     Bethel BornKelly Marie Brytnee Bechler, PA-C 08/12/16 2245    Rolland PorterMark James, MD 08/20/16 [phone]

## 2018-02-25 ENCOUNTER — Encounter (HOSPITAL_COMMUNITY): Payer: Self-pay | Admitting: Emergency Medicine

## 2018-02-25 ENCOUNTER — Emergency Department (HOSPITAL_COMMUNITY): Payer: Non-veteran care

## 2018-02-25 ENCOUNTER — Emergency Department (HOSPITAL_COMMUNITY)
Admission: EM | Admit: 2018-02-25 | Discharge: 2018-02-25 | Disposition: A | Discharge status: Home / Self Care | Payer: Non-veteran care | Attending: Emergency Medicine | Admitting: Emergency Medicine

## 2018-02-25 DIAGNOSIS — L03116 Cellulitis of left lower limb: Secondary | ICD-10-CM | POA: Diagnosis not present

## 2018-02-25 DIAGNOSIS — I10 Essential (primary) hypertension: Secondary | ICD-10-CM | POA: Insufficient documentation

## 2018-02-25 DIAGNOSIS — F17210 Nicotine dependence, cigarettes, uncomplicated: Secondary | ICD-10-CM | POA: Insufficient documentation

## 2018-02-25 DIAGNOSIS — Z79899 Other long term (current) drug therapy: Secondary | ICD-10-CM | POA: Insufficient documentation

## 2018-02-25 DIAGNOSIS — M79672 Pain in left foot: Secondary | ICD-10-CM | POA: Diagnosis present

## 2018-02-25 LAB — CBC WITH DIFFERENTIAL/PLATELET
Abs Immature Granulocytes: 0.1 10*3/uL (ref 0.0–0.1)
Basophils Absolute: 0 10*3/uL (ref 0.0–0.1)
Basophils Relative: 0 %
EOS ABS: 0.4 10*3/uL (ref 0.0–0.7)
EOS PCT: 2 %
HEMATOCRIT: 43.2 % (ref 39.0–52.0)
Hemoglobin: 13.9 g/dL (ref 13.0–17.0)
IMMATURE GRANULOCYTES: 0 %
LYMPHS ABS: 3.2 10*3/uL (ref 0.7–4.0)
Lymphocytes Relative: 19 %
MCH: 30.2 pg (ref 26.0–34.0)
MCHC: 32.2 g/dL (ref 30.0–36.0)
MCV: 93.7 fL (ref 78.0–100.0)
MONO ABS: 1.6 10*3/uL — AB (ref 0.1–1.0)
MONOS PCT: 10 %
NEUTROS PCT: 69 %
Neutro Abs: 11.8 10*3/uL — ABNORMAL HIGH (ref 1.7–7.7)
Platelets: 197 10*3/uL (ref 150–400)
RBC: 4.61 MIL/uL (ref 4.22–5.81)
RDW: 15.4 % (ref 11.5–15.5)
WBC: 17.2 10*3/uL — ABNORMAL HIGH (ref 4.0–10.5)

## 2018-02-25 LAB — URINALYSIS, ROUTINE W REFLEX MICROSCOPIC
Bilirubin Urine: NEGATIVE
GLUCOSE, UA: NEGATIVE mg/dL
Hgb urine dipstick: NEGATIVE
KETONES UR: NEGATIVE mg/dL
LEUKOCYTES UA: NEGATIVE
Nitrite: NEGATIVE
Protein, ur: NEGATIVE mg/dL
Specific Gravity, Urine: 1.018 (ref 1.005–1.030)
pH: 5 (ref 5.0–8.0)

## 2018-02-25 LAB — COMPREHENSIVE METABOLIC PANEL
ALT: 21 U/L (ref 17–63)
AST: 21 U/L (ref 15–41)
Albumin: 4.1 g/dL (ref 3.5–5.0)
Alkaline Phosphatase: 83 U/L (ref 38–126)
Anion gap: 8 (ref 5–15)
BUN: 8 mg/dL (ref 6–20)
CO2: 27 mmol/L (ref 22–32)
CREATININE: 0.83 mg/dL (ref 0.61–1.24)
Calcium: 9.6 mg/dL (ref 8.9–10.3)
Chloride: 105 mmol/L (ref 101–111)
GFR calc Af Amer: >60 mL/min (ref >60)
GFR calc non Af Amer: >60 mL/min (ref >60)
Glucose, Bld: 112 mg/dL — ABNORMAL HIGH (ref 65–99)
Potassium: 4.4 mmol/L (ref 3.5–5.1)
SODIUM: 140 mmol/L (ref 135–145)
Total Bilirubin: 0.5 mg/dL (ref 0.3–1.2)
Total Protein: 7.4 g/dL (ref 6.5–8.1)

## 2018-02-25 LAB — I-STAT CG4 LACTIC ACID, ED
Lactic Acid, Venous: 2.13 mmol/L (ref 0.5–1.9)
Lactic Acid, Venous: 1.18 mmol/L (ref 0.5–1.9)

## 2018-02-25 MED ORDER — CLINDAMYCIN HCL 150 MG PO CAPS: 150.0000 mg | ORAL_CAPSULE | Freq: Four times a day (QID) | ORAL | 0 refills | Status: DC

## 2018-02-25 MED ORDER — CEFAZOLIN SODIUM-DEXTROSE 1-4 GM/50ML-% IV SOLN: 1.0000 g | Freq: Once | INTRAVENOUS | Status: DC

## 2018-02-25 MED ORDER — CLINDAMYCIN PHOSPHATE 600 MG/50ML IV SOLN
600.0000 mg | Freq: Once | INTRAVENOUS | Status: AC
  Administered 2018-02-25: 600 mg via INTRAVENOUS
  Filled 2018-02-25: qty 50

## 2018-02-25 MED ORDER — SULFAMETHOXAZOLE-TRIMETHOPRIM 800-160 MG PO TABS: 1.0000 | ORAL_TABLET | Freq: Once | ORAL | Status: DC

## 2018-02-25 NOTE — ED Provider Notes (Signed)
[facility] EMERGENCY DEPARTMENT Provider Note   CSN: [id number] Arrival date & time: 02/25/18  1828     History   Chief Complaint Chief Complaint  Patient presents with  . Cellulitis    HPI Elijah Werner is a 62 y.o. male.  Patient with high blood pressure history presents with worsening redness warmth and tenderness to left foot for the past 3 or 4 days. No injuries. Patient does have dry toes and feet normally. Patient denies any diabetes or immunosuppressive history. No current antibiotics. Swelling gradually worsening.     Past Medical History:  Diagnosis Date  . Hypertension   . Pancreatitis     There are no active problems to display for this patient.   History reviewed. No pertinent surgical history.      Home Medications    Prior to Admission medications   Medication Sig Start Date End Date Taking? Authorizing Provider  amLODipine (NORVASC) 5 MG tablet Take 5 mg by mouth daily.   Yes [provider]  Multiple Vitamins-Minerals (MULTIVITAMIN WITH MINERALS) tablet Take 1 tablet by mouth daily.   Yes [provider]  vitamin E 100 UNIT capsule Take 200 Units by mouth daily.   Yes [provider]  clindamycin (CLEOCIN) 150 MG capsule Take 1 capsule (150 mg total) by mouth every 6 (six) hours. 02/25/18   Blane Ohara, MD  HYDROcodone-acetaminophen (NORCO/VICODIN) 5-325 MG per tablet Take 1 tablet by mouth every 6 (six) hours as needed for moderate pain. Patient not taking: Reported on 02/25/2018 04/29/15   Fayrene Helper, PA-C  ibuprofen (ADVIL,MOTRIN) 600 MG tablet Take 1 tablet (600 mg total) by mouth 3 (three) times daily. Patient not taking: Reported on 02/25/2018 08/12/16   Bethel Born, PA-C  methocarbamol (ROBAXIN) 500 MG tablet Take 1 tablet (500 mg total) by mouth at bedtime and may repeat dose one time if needed. Patient not taking: Reported on 02/25/2018 08/12/16   Bethel Born, PA-C  tamsulosin  (FLOMAX) 0.4 MG CAPS capsule Take 1 capsule (0.4 mg total) by mouth daily. Patient not taking: Reported on 02/25/2018 04/29/15   Fayrene Helper, PA-C    Family History No family history on file.  Social History Social History   Tobacco Use  . Smoking status: Current Every Day Smoker    Packs/day: 0.50    Types: Cigarettes  . Smokeless tobacco: Never Used  Substance Use Topics  . Alcohol use: No  . Drug use: No     Allergies   Patient has no known allergies.   Review of Systems Review of Systems  Constitutional: Negative for chills and fever.  HENT: Negative for congestion.   Respiratory: Negative for shortness of breath.   Cardiovascular: Negative for chest pain.  Gastrointestinal: Negative for abdominal pain and vomiting.  Genitourinary: Negative for dysuria and flank pain.  Musculoskeletal: Negative for back pain, neck pain and neck stiffness.  Skin: Positive for rash.  Neurological: Negative for light-headedness and headaches.     Physical Exam Updated Vital Signs BP 128/73   Pulse 60   Temp 100 F (37.8 C) (Oral)   Resp 16   Ht  (1.727 m)   Wt 61.2 kg (135 lb)   SpO2 99%   BMI 20.53 kg/m   Physical Exam  Constitutional: He is oriented to person, place, and time. He appears well-developed and well-nourished.  HENT:  Head: Normocephalic and atraumatic.  Eyes: Conjunctivae are normal. Right eye exhibits no discharge. Left eye  exhibits no discharge.  Neck: Normal range of motion. Neck supple. No tracheal deviation present.  Cardiovascular: Normal rate and regular rhythm.  Pulmonary/Chest: Effort normal and breath sounds normal.  Abdominal: Soft. He exhibits no distension. There is no tenderness. There is no guarding.  Musculoskeletal: He exhibits edema and tenderness.  Neurological: He is alert and oriented to person, place, and time.  Skin: Skin is warm. Rash noted.  Left foot erythema, warmth without induration or crepitus. Extends just above ankle  dorsal aspect of the foot. Tender to palpation. Dry toes distally. No ulcer appreciated.  Psychiatric: He has a normal mood and affect.  Nursing note and vitals reviewed.    ED Treatments / Results  Labs (all labs ordered are listed, but only abnormal results are displayed) Labs Reviewed  COMPREHENSIVE METABOLIC PANEL - Abnormal; Notable for the following components:      Result Value   Glucose, Bld 112 (*)    All other components within normal limits  CBC WITH DIFFERENTIAL/PLATELET - Abnormal; Notable for the following components:   WBC 17.2 (*)    Neutro Abs 11.8 (*)    Monocytes Absolute 1.6 (*)    All other components within normal limits  I-STAT CG4 LACTIC ACID, ED - Abnormal; Notable for the following components:   Lactic Acid, Venous 2.13 (*)    All other components within normal limits  CULTURE, BLOOD (ROUTINE X 2)  CULTURE, BLOOD (ROUTINE X 2)  URINALYSIS, ROUTINE W REFLEX MICROSCOPIC  I-STAT CG4 LACTIC ACID, ED    EKG None  Radiology Dg Foot 2 Views Left  Result Date: 02/25/2018 CLINICAL DATA:  Cellulitis, swelling to the entire left foot EXAM: LEFT FOOT - 2 VIEW COMPARISON:  None. FINDINGS: There is no evidence of fracture or dislocation. There is no evidence of arthropathy or other focal bone abnormality. No soft tissue gas or radiopaque foreign body. IMPRESSION: No acute osseous abnormality Electronically Signed   By: Jasmine Pang M.D.   On: 02/25/2018 22:12    Procedures Procedures (including critical care time)  Medications Ordered in ED Medications  clindamycin (CLEOCIN) IVPB 600 mg (0 mg Intravenous Stopped 02/25/18 2244)     Initial Impression / Assessment and Plan / ED Course  I have reviewed the triage vital signs and the nursing notes.  Pertinent labs & imaging results that were available during my care of the patient were reviewed by me and considered in my medical decision making (see chart for details).    Patient presents with clinical  cellulitis left foot. Patient's overall well appearing blood work showed leukocytosis normal lactic acid. Screening x-ray unremarkable. Patient stable for outpatient follow-up and reassessment in 48 hours. Discussed reasons to return. First dose of abx given in the ER.  Results and differential diagnosis were discussed with the patient/parent/guardian. Xrays were independently reviewed by myself.  Close follow up outpatient was discussed, comfortable with the plan.   Medications  clindamycin (CLEOCIN) IVPB 600 mg (0 mg Intravenous Stopped 02/25/18 2244)    Vitals:   02/25/18 2045 02/25/18 2115 02/25/18 2130 02/25/18 2230  BP: (!) 155/75 128/69 129/68 128/73  Pulse: 65 (!) 59 (!) 57 60  Resp:  Temp:      TempSrc:      SpO2: 100% 100% 99% 99%  Weight:      Height:        Final diagnoses:  Cellulitis of left foot excluding toes     Final Clinical Impressions(s) / ED  Diagnoses   Final diagnoses:  Cellulitis of left foot excluding toes    ED Discharge Orders        Ordered    clindamycin (CLEOCIN) 150 MG capsule  Every 6 hours     02/25/18 2247       Blane Ohara, MD 02/25/18 2250

## 2018-02-25 NOTE — ED Notes (Signed)
ED Provider at bedside. 

## 2018-02-25 NOTE — ED Triage Notes (Signed)
Pt presents with L foot redness/swelling, redness noted traveling up lower extremity, pt reports seeing redness x 2-3 days.. Pt denies injury.

## 2018-02-25 NOTE — Discharge Instructions (Signed)
Take antibiotics as directed. For pain you can take Tylenol and Motrin as needed. Keep that left leg and foot clean. No working until your infection is controlled. If he develop persistent vomiting, rapidly spreading redness to the leg or worsening symptoms he need to be seen immediately.

## 2018-02-25 NOTE — ED Notes (Signed)
Patient transported to X-ray 

## 2018-02-26 ENCOUNTER — Telehealth (HOSPITAL_BASED_OUTPATIENT_CLINIC_OR_DEPARTMENT_OTHER): Payer: Self-pay | Admitting: Emergency Medicine

## 2018-02-26 LAB — BLOOD CULTURE ID PANEL (REFLEXED)
ACINETOBACTER BAUMANNII: NOT DETECTED
Candida albicans: NOT DETECTED
Candida glabrata: NOT DETECTED
Candida krusei: NOT DETECTED
Candida parapsilosis: NOT DETECTED
Candida tropicalis: NOT DETECTED
Enterobacter cloacae complex: NOT DETECTED
Enterobacteriaceae species: NOT DETECTED
Enterococcus species: NOT DETECTED
Escherichia coli: NOT DETECTED
Haemophilus influenzae: NOT DETECTED
KLEBSIELLA OXYTOCA: NOT DETECTED
Klebsiella pneumoniae: NOT DETECTED
Listeria monocytogenes: NOT DETECTED
Methicillin resistance: NOT DETECTED
Neisseria meningitidis: NOT DETECTED
PSEUDOMONAS AERUGINOSA: NOT DETECTED
Proteus species: NOT DETECTED
STREPTOCOCCUS PNEUMONIAE: NOT DETECTED
STREPTOCOCCUS PYOGENES: NOT DETECTED
Serratia marcescens: NOT DETECTED
Staphylococcus aureus (BCID): NOT DETECTED
Staphylococcus species: DETECTED — AB
Streptococcus agalactiae: NOT DETECTED
Streptococcus species: NOT DETECTED

## 2018-02-26 NOTE — Telephone Encounter (Signed)
Micro lab called and reported a positive BC - MD zachowski aware and asked for this RN to call the patient and have the patient follow up at [facility]  - patients phone number on files called - no voicemail available. The patients sister is listed as another contact and called and left message for her to ask the patient to call for follow up

## 2018-02-27 ENCOUNTER — Encounter (HOSPITAL_COMMUNITY): Payer: Self-pay

## 2018-02-27 ENCOUNTER — Telehealth (HOSPITAL_BASED_OUTPATIENT_CLINIC_OR_DEPARTMENT_OTHER): Payer: Self-pay | Admitting: *Deleted

## 2018-02-27 ENCOUNTER — Emergency Department (HOSPITAL_COMMUNITY)
Admission: EM | Admit: 2018-02-27 | Discharge: 2018-02-27 | Disposition: A | Discharge status: Home / Self Care | Payer: Non-veteran care | Attending: Emergency Medicine | Admitting: Emergency Medicine

## 2018-02-27 ENCOUNTER — Other Ambulatory Visit: Payer: Self-pay

## 2018-02-27 DIAGNOSIS — F17210 Nicotine dependence, cigarettes, uncomplicated: Secondary | ICD-10-CM | POA: Diagnosis not present

## 2018-02-27 DIAGNOSIS — L03116 Cellulitis of left lower limb: Secondary | ICD-10-CM | POA: Insufficient documentation

## 2018-02-27 DIAGNOSIS — I10 Essential (primary) hypertension: Secondary | ICD-10-CM | POA: Insufficient documentation

## 2018-02-27 DIAGNOSIS — Z79899 Other long term (current) drug therapy: Secondary | ICD-10-CM | POA: Diagnosis not present

## 2018-02-27 LAB — CBC WITH DIFFERENTIAL/PLATELET
Abs Immature Granulocytes: 0.1 10*3/uL (ref 0.0–0.1)
BASOS ABS: 0.1 10*3/uL (ref 0.0–0.1)
Basophils Relative: 0 %
EOS PCT: 1 %
Eosinophils Absolute: 0.2 10*3/uL (ref 0.0–0.7)
HCT: 42.9 % (ref 39.0–52.0)
Hemoglobin: 14.1 g/dL (ref 13.0–17.0)
Immature Granulocytes: 0 %
LYMPHS PCT: 13 %
Lymphs Abs: 2.2 10*3/uL (ref 0.7–4.0)
MCH: 30.3 pg (ref 26.0–34.0)
MCHC: 32.9 g/dL (ref 30.0–36.0)
MCV: 92.3 fL (ref 78.0–100.0)
Monocytes Absolute: 1.3 10*3/uL — ABNORMAL HIGH (ref 0.1–1.0)
Monocytes Relative: 8 %
NEUTROS PCT: 78 %
Neutro Abs: 13 10*3/uL — ABNORMAL HIGH (ref 1.7–7.7)
Platelets: 222 10*3/uL (ref 150–400)
RBC: 4.65 MIL/uL (ref 4.22–5.81)
RDW: 14.9 % (ref 11.5–15.5)
WBC: 16.9 10*3/uL — AB (ref 4.0–10.5)

## 2018-02-27 LAB — COMPREHENSIVE METABOLIC PANEL
ALT: 24 U/L (ref 17–63)
ANION GAP: 8 (ref 5–15)
AST: 20 U/L (ref 15–41)
Albumin: 3.9 g/dL (ref 3.5–5.0)
Alkaline Phosphatase: 81 U/L (ref 38–126)
BUN: 7 mg/dL (ref 6–20)
CHLORIDE: 100 mmol/L — AB (ref 101–111)
CO2: 30 mmol/L (ref 22–32)
Calcium: 9.5 mg/dL (ref 8.9–10.3)
Creatinine, Ser: 0.81 mg/dL (ref 0.61–1.24)
Glucose, Bld: 248 mg/dL — ABNORMAL HIGH (ref 65–99)
Potassium: 3.9 mmol/L (ref 3.5–5.1)
Sodium: 138 mmol/L (ref 135–145)
TOTAL PROTEIN: 7.6 g/dL (ref 6.5–8.1)
Total Bilirubin: 0.6 mg/dL (ref 0.3–1.2)

## 2018-02-27 LAB — I-STAT CG4 LACTIC ACID, ED: LACTIC ACID, VENOUS: 1.89 mmol/L (ref 0.5–1.9)

## 2018-02-27 NOTE — ED Provider Notes (Signed)
[facility] EMERGENCY DEPARTMENT Provider Note  CSN: [id number] Arrival date & time: 02/27/18 1213  Chief Complaint(s) Cellulitis  HPI Elijah Werner is a 62 y.o. male with a history of hypertension who was recently diagnosed with left lower extremity cellulitis and prescribed clindamycin 2 days ago.  Patient presents today after being called by 1 of the nurses here at [facility] for a positive blood culture with staph.  Patient reports that he started taking his clindamycin last night and is taken 1 dose today.  States that the cellulitis is unchanged.  He endorses associated pain with palpation and ambulation.  Alleviated by immobility.  Denies any fevers or chills.  No chest pain or shortness of breath.  No nausea or vomiting.  No abdominal pain.  Denies any other physical complaints.   HPI   Past Medical History Past Medical History:  Diagnosis Date  . Hypertension   . Pancreatitis    There are no active problems to display for this patient.  Home Medication(s) Prior to Admission medications   Medication Sig Start Date End Date Taking? Authorizing Provider  amLODipine (NORVASC) 5 MG tablet Take 5 mg by mouth daily.    [provider]  clindamycin (CLEOCIN) 150 MG capsule Take 1 capsule (150 mg total) by mouth every 6 (six) hours. 02/25/18   Blane Ohara, MD  HYDROcodone-acetaminophen (NORCO/VICODIN) 5-325 MG per tablet Take 1 tablet by mouth every 6 (six) hours as needed for moderate pain. 04/29/15   Fayrene Helper, PA-C  ibuprofen (ADVIL,MOTRIN) 600 MG tablet Take 1 tablet (600 mg total) by mouth 3 (three) times daily. 08/12/16   Bethel Born, PA-C  methocarbamol (ROBAXIN) 500 MG tablet Take 1 tablet (500 mg total) by mouth at bedtime and may repeat dose one time if needed. 08/12/16   Bethel Born, PA-C  Multiple Vitamins-Minerals (MULTIVITAMIN WITH MINERALS) tablet Take 1 tablet by mouth daily.    [provider]  tamsulosin (FLOMAX) 0.4  MG CAPS capsule Take 1 capsule (0.4 mg total) by mouth daily. 04/29/15   Fayrene Helper, PA-C  vitamin E 100 UNIT capsule Take 200 Units by mouth daily.    [provider]                                                                                                                                    Past Surgical History History reviewed. No pertinent surgical history. Family History History reviewed. No pertinent family history.  Social History Social History   Tobacco Use  . Smoking status: Current Every Day Smoker    Packs/day: 0.50    Types: Cigarettes  . Smokeless tobacco: Never Used  Substance Use Topics  . Alcohol use: No  . Drug use: No   Allergies Patient has no known allergies.  Review of Systems Review of Systems All other systems are reviewed and are negative for acute change except as  noted in the HPI  Physical Exam Vital Signs  I have reviewed the triage vital signs BP (!) 160/92 (BP Location: Right Arm)   Pulse 62   Temp 97.7 F (36.5 C) (Oral)   Resp 18   SpO2 97%   Physical Exam  Constitutional: He is oriented to person, place, and time. He appears well-developed and well-nourished. No distress.  HENT:  Head: Normocephalic and atraumatic.  Right Ear: External ear normal.  Left Ear: External ear normal.  Nose: Nose normal.  Mouth/Throat: Mucous membranes are normal. No trismus in the jaw.  Eyes: Conjunctivae and EOM are normal. No scleral icterus.  Neck: Normal range of motion and phonation normal.  Cardiovascular: Normal rate and regular rhythm.  Pulmonary/Chest: Effort normal. No stridor. No respiratory distress.  Abdominal: He exhibits no distension.  Musculoskeletal: Normal range of motion. He exhibits no edema.       Legs: Erythema, swelling and induration to left lower extremity from foot to just above the left ankle.  Tenderness to palpation.  Neurological: He is alert and oriented to person, place, and time.  Skin: He is not  diaphoretic.  Psychiatric: He has a normal mood and affect. His behavior is normal.  Vitals reviewed.   ED Results and Treatments Labs (all labs ordered are listed, but only abnormal results are displayed) Labs Reviewed  COMPREHENSIVE METABOLIC PANEL - Abnormal; Notable for the following components:      Result Value   Chloride 100 (*)    Glucose, Bld 248 (*)    All other components within normal limits  CBC WITH DIFFERENTIAL/PLATELET - Abnormal; Notable for the following components:   WBC 16.9 (*)    Neutro Abs 13.0 (*)    Monocytes Absolute 1.3 (*)    All other components within normal limits  I-STAT CG4 LACTIC ACID, ED  I-STAT CG4 LACTIC ACID, ED                                                                                                                         EKG  EKG Interpretation  Date/Time:    Ventricular Rate:    PR Interval:    QRS Duration:   QT Interval:    QTC Calculation:   R Axis:     Text Interpretation:        Radiology No results found. Pertinent labs & imaging results that were available during my care of the patient were reviewed by me and considered in my medical decision making (see chart for details).  Medications Ordered in ED Medications - No data to display  Procedures Procedures  (including critical care time)  Medical Decision Making / ED Course I have reviewed the nursing notes for this encounter and the patient's prior records (if available in EHR or on provided paperwork).    Positive blood culture 1 out of 2 obtained 2 days ago likely contaminant from MSSA staph.  Repeat labs with stable leukocytosis.  Lactic acid within normal limits.  Hyperglycemia without DKA.  Last CMP with mildly elevated glucose.  Likely reaction from infection versus recent food intake.  Cellulitis stable.  Recommended he  continue taking oral antibiotics.  Close strict return precautions given.  The patient appears reasonably screened and/or stabilized for discharge and I doubt any other medical condition or other [facility] requiring further screening, evaluation, or treatment in the ED at this time prior to discharge.   Final Clinical Impression(s) / ED Diagnoses Final diagnoses:  Cellulitis of left lower extremity   Disposition: Discharge  Condition: Good  I have discussed the results, Dx and Tx plan with the patient who expressed understanding and agree(s) with the plan. Discharge instructions discussed at great length. The patient was given strict return precautions who verbalized understanding of the instructions. No further questions at time of discharge.    ED Discharge Orders    None       Follow Up: Lavinia Sharps, NP [address] [place] [address] [phone]   IN 2-3 days, If symptoms do not improve or  worsen      This chart was dictated using voice recognition software.  Despite best efforts to proofread,  errors can occur which can change the documentation meaning.   Nira Conn, MD 02/27/18 1740

## 2018-02-27 NOTE — ED Notes (Signed)
Patient Alert and oriented to baseline. Stable and ambulatory to baseline. Patient verbalized understanding of the discharge instructions.  Patient belongings were taken by the patient.   

## 2018-02-27 NOTE — ED Provider Notes (Signed)
Patient placed in Quick Look pathway, seen and evaluated   Chief Complaint: L foot swelling and pain  HPI:   Has recently been seen for redness and swelling of his left foot.  He was started on antibiotics, took his first dose of oral medication yesterday but 1 of his blood cultures was read as positive and he was sent back into the emergency department for recheck.  He denies fevers but states he still has ongoing redness and swelling of his foot.  ROS: Positive for redness, positive for swelling of the foot, negative for fever (one)  Physical Exam:   Gen: No distress  Neuro: Awake and Alert  Skin: Warm    Focused Exam: Left foot with edema extending several inches up the lower tibia.  Skin is red and warm to the touch, foot is diffusely swollen and red.  Pulses are normal.  There is tenderness with palpation.  The patient is in no distress otherwise.   Will need to have repeat labs, cultures.  The patient is nontoxic-appearing   initiation of care has begun. The patient has been counseled on the process, plan, and necessity for staying for the completion/evaluation, and the remainder of the medical screening examination    Eber Hong, MD 02/27/18 1239

## 2018-02-27 NOTE — ED Triage Notes (Signed)
Pt here Sunday for leg swelling and redness. Dx with cellulitis. Pt reports infection has gotten worse and he was sent over by PCP for more blood work. Pt states he has been taking abx starting yesterday.

## 2018-02-28 LAB — CULTURE, BLOOD (ROUTINE X 2): Special Requests: ADEQUATE

## 2018-03-01 ENCOUNTER — Telehealth: Payer: Self-pay | Admitting: *Deleted

## 2018-03-01 NOTE — Telephone Encounter (Signed)
Post ED Visit - Positive Culture Follow-up  Culture report reviewed by antimicrobial stewardship pharmacist:   Enzo Bi, Pharm.D.  Celedonio Miyamoto, Pharm.D., BCPS AQ-ID  Garvin Fila, Pharm.D., BCPS  Georgina Pillion, Pharm.D., BCPS  [place], [place].D., BCPS, AAHIVP  Estella Husk, Pharm.D., BCPS, AAHIVP  Lysle Pearl, PharmD, BCPS  Sherlynn Carbon, PharmD  Pollyann Samples, PharmD, BCPS Sharin Mons, PharmD  Positive blood culture Ruled contaminant by ER Physician on return visit and no further patient follow-up is required at this time.  Virl Axe [facility] 03/01/2018, 2:14 PM

## 2018-03-02 LAB — CULTURE, BLOOD (ROUTINE X 2)
CULTURE: NO GROWTH
Special Requests: ADEQUATE

## 2020-06-20 ENCOUNTER — Other Ambulatory Visit: Payer: Self-pay

## 2020-06-20 ENCOUNTER — Encounter (HOSPITAL_COMMUNITY): Payer: Self-pay

## 2020-06-20 ENCOUNTER — Ambulatory Visit (HOSPITAL_COMMUNITY)
Admission: EM | Admit: 2020-06-20 | Discharge: 2020-06-20 | Disposition: A | Discharge status: Home / Self Care | Payer: Non-veteran care | Attending: Urgent Care | Admitting: Urgent Care

## 2020-06-20 DIAGNOSIS — B029 Zoster without complications: Secondary | ICD-10-CM | POA: Diagnosis not present

## 2020-06-20 DIAGNOSIS — R21 Rash and other nonspecific skin eruption: Secondary | ICD-10-CM

## 2020-06-20 MED ORDER — VALACYCLOVIR HCL 1 G PO TABS: 1000.0000 mg | ORAL_TABLET | Freq: Three times a day (TID) | ORAL | 0 refills | Status: AC

## 2020-06-20 NOTE — ED Triage Notes (Signed)
Pt c/o rash on abdomenx1 wk. Pt denies new meds or products or foods. Pt has clusters of red blisters on right side of abdomen, right flank and right back.

## 2020-06-20 NOTE — Discharge Instructions (Signed)
Please just use Tylenol at a dose of 500mg-650mg once every 6 hours as needed for your aches, pains, fevers. Do not use any nonsteroidal anti-inflammatories (NSAIDs) like ibuprofen, Motrin, naproxen, Aleve, etc. which are all available over-the-counter.   

## 2020-06-20 NOTE — ED Provider Notes (Signed)
[facility]   MRN: [id number] DOB: 14-Sep-1956  Subjective:   Elijah Werner is a 64 y.o. male presenting for 1 week hx of acute onset itchy rash of right flank side.   No current facility-administered medications for this encounter.  Current Outpatient Medications:  .  amLODipine (NORVASC) 5 MG tablet, Take 5 mg by mouth daily., Disp: , Rfl:  .  clindamycin (CLEOCIN) 150 MG capsule, Take 1 capsule (150 mg total) by mouth every 6 (six) hours., Disp: 28 capsule, Rfl: 0 .  HYDROcodone-acetaminophen (NORCO/VICODIN) 5-325 MG per tablet, Take 1 tablet by mouth every 6 (six) hours as needed for moderate pain., Disp: 12 tablet, Rfl: 0 .  ibuprofen (ADVIL,MOTRIN) 600 MG tablet, Take 1 tablet (600 mg total) by mouth 3 (three) times daily., Disp: 21 tablet, Rfl: 0 .  methocarbamol (ROBAXIN) 500 MG tablet, Take 1 tablet (500 mg total) by mouth at bedtime and may repeat dose one time if needed., Disp: 10 tablet, Rfl: 0 .  Multiple Vitamins-Minerals (MULTIVITAMIN WITH MINERALS) tablet, Take 1 tablet by mouth daily., Disp: , Rfl:  .  tamsulosin (FLOMAX) 0.4 MG CAPS capsule, Take 1 capsule (0.4 mg total) by mouth daily., Disp: 6 capsule, Rfl: 0 .  vitamin E 100 UNIT capsule, Take 200 Units by mouth daily., Disp: , Rfl:    No Known Allergies  Past Medical History:  Diagnosis Date  . Hypertension   . Pancreatitis      History reviewed. No pertinent surgical history.  No family history on file.  Social History   Tobacco Use  . Smoking status: Current Every Day Smoker    Packs/day: 0.50    Types: Cigarettes  . Smokeless tobacco: Never Used  Substance Use Topics  . Alcohol use: No  . Drug use: No    ROS   Objective:   Vitals: BP (!) 146/77   Pulse 67   Temp 98.1 F (36.7 C) (Oral)   Resp 18   Ht 5\' 8"  (1.727 m)   Wt 138 lb (62.6 kg)   SpO2 95%   BMI 20.98 kg/m   Physical Exam Constitutional:      General: He is not in acute distress.    Appearance: Normal  appearance. He is well-developed and normal weight. He is not ill-appearing, toxic-appearing or diaphoretic.  HENT:     Head: Normocephalic and atraumatic.     Right Ear: External ear normal.     Left Ear: External ear normal.     Nose: Nose normal.     Mouth/Throat:     Pharynx: Oropharynx is clear.  Eyes:     General: No scleral icterus.       Right eye: No discharge.        Left eye: No discharge.     Extraocular Movements: Extraocular movements intact.     Pupils: Pupils are equal, round, and reactive to light.  Cardiovascular:     Rate and Rhythm: Normal rate.  Pulmonary:     Effort: Pulmonary effort is normal.  Musculoskeletal:     Cervical back: Normal range of motion.  Skin:    Findings: Rash (patches of vesicular lesions on right flank side in dermatome pattern) present.  Neurological:     Mental Status: He is alert and oriented to person, place, and time.  Psychiatric:        Mood and Affect: Mood normal.        Behavior: Behavior normal.  Thought Content: Thought content normal.        Judgment: Judgment normal.     Assessment and Plan :   PDMP not reviewed this encounter.  1. Herpes zoster without complication   2. Rash and nonspecific skin eruption     Start Valtrex. Patient has minimal pain. Use APAP and supportive care. Counseled patient on potential for adverse effects with medications prescribed/recommended today, ER and return-to-clinic precautions discussed, patient verbalized understanding.    Wallis Bamberg, PA-C 06/21/20 1004

## 2020-06-21 ENCOUNTER — Encounter (HOSPITAL_COMMUNITY): Payer: Self-pay | Admitting: Urgent Care

## 2020-07-25 ENCOUNTER — Encounter (HOSPITAL_COMMUNITY): Payer: Self-pay | Admitting: Emergency Medicine

## 2020-07-25 ENCOUNTER — Ambulatory Visit (HOSPITAL_COMMUNITY)
Admission: EM | Admit: 2020-07-25 | Discharge: 2020-07-25 | Disposition: A | Discharge status: Home / Self Care | Payer: Non-veteran care | Attending: Family Medicine | Admitting: Family Medicine

## 2020-07-25 ENCOUNTER — Other Ambulatory Visit: Payer: Self-pay

## 2020-07-25 DIAGNOSIS — B0229 Other postherpetic nervous system involvement: Secondary | ICD-10-CM | POA: Diagnosis not present

## 2020-07-25 MED ORDER — TRIAMCINOLONE ACETONIDE 0.025 % EX OINT: 1.0000 "application " | TOPICAL_OINTMENT | Freq: Three times a day (TID) | CUTANEOUS | 0 refills | Status: DC

## 2020-07-25 MED ORDER — GABAPENTIN 100 MG PO CAPS: 100.0000 mg | ORAL_CAPSULE | Freq: Two times a day (BID) | ORAL | 0 refills | Status: DC

## 2020-07-25 NOTE — Discharge Instructions (Addendum)
Apply triamcinolone cream to the area in which the shingles rash developed and pain is present.  You may apply cream 3 times a day. Of also prescribed gabapentin to take 1 tablet every 12 hours as needed.  This medication can cause severe drowsiness therefore start taking medication at bedtime and avoid driving until full effects of medication is known.

## 2020-07-25 NOTE — ED Provider Notes (Signed)
[facility]    CSN: [id number] Arrival date & time: 07/25/20  1013      History   Chief Complaint Chief Complaint  Patient presents with  . Herpes Zoster    HPI Elijah Werner is a 64 y.o. male.   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  The rash has healed however he has some residual scarring and complains of tactile pain with any contact with clothing or touching of the area in which the shingles rash developed.  He has been taking ibuprofen and Tylenol around-the-clock without relief.  He completed the course of Valtrex.  He has no current open lesions.  Past Medical History:  Diagnosis Date  . Hypertension   . Pancreatitis     There are no problems to display for this patient.   History reviewed. No pertinent surgical history.     Home Medications    Prior to Admission medications   Medication Sig Start Date End Date Taking? Authorizing Provider  amLODipine (NORVASC) 5 MG tablet Take 5 mg by mouth daily.    [provider]  ibuprofen (ADVIL,MOTRIN) 600 MG tablet Take 1 tablet (600 mg total) by mouth 3 (three) times daily. 08/12/16   Bethel Born, PA-C  Multiple Vitamins-Minerals (MULTIVITAMIN WITH MINERALS) tablet Take 1 tablet by mouth daily.    [provider]  tamsulosin (FLOMAX) 0.4 MG CAPS capsule Take 1 capsule (0.4 mg total) by mouth daily. 04/29/15   Fayrene Helper, PA-C  vitamin E 100 UNIT capsule Take 200 Units by mouth daily.    [provider]    Family History History reviewed. No pertinent family history.  Social History Social History   Tobacco Use  . Smoking status: Current Every Day Smoker    Packs/day: 0.50    Types: Cigarettes  . Smokeless tobacco: Never Used  Substance Use Topics  . Alcohol use: No  . Drug use: No     Allergies   Patient has no known  allergies.   Review of Systems Review of Systems Pertinent negatives listed in HPI Physical Exam Triage Vital Signs ED Triage Vitals [07/25/20 1044]  Enc Vitals Group     BP (!) 127/108     Pulse Rate 68     Resp 18     Temp (!) 97.5 F (36.4 C)     Temp Source Oral     SpO2 98 %     Weight      Height      Head Circumference      Peak Flow      Pain Score 8     Pain Loc      Pain Edu?      Excl. in GC?    No data found.  Updated Vital Signs BP (!) 127/108 (BP Location: Left Arm)   Pulse 68   Temp (!) 97.5 F (36.4 C) (Oral)   Resp 18   SpO2 98%   Visual Acuity Right Eye Distance:   Left Eye Distance:   Bilateral Distance:    Right Eye Near:   Left Eye Near:    Bilateral Near:     Physical Exam Constitutional:      Appearance: Normal appearance.  Cardiovascular:     Rate and Rhythm: Normal rate and regular rhythm.  Pulmonary:     Effort: Pulmonary effort is normal.  Breath sounds: Normal breath sounds and air entry.  Chest:     Comments: Healed zoster type rash extending from the lower right chest wall ending at the right mid thoracic region.  There is no excoriation however the healed rash remains visible. Pain with tactile stimulation Skin:    Capillary Refill: Capillary refill takes less than 2 seconds.  Neurological:     General: No focal deficit present.     Mental Status: He is alert and oriented to person, place, and time.  Psychiatric:        Attention and Perception: Attention normal.        Mood and Affect: Mood is anxious.        Speech: Speech normal.        Behavior: Behavior normal.        Thought Content: Thought content normal.    UC Treatments / Results  Labs (all labs ordered are listed, but only abnormal results are displayed) Labs Reviewed - No data to display  EKG   Radiology No results found.  Procedures Procedures (including critical care time)  Medications Ordered in UC Medications - No data to  display  Initial Impression / Assessment and Plan / UC Course  I have reviewed the triage vital signs and the nursing notes.  Pertinent labs & imaging results that were available during my care of the patient were reviewed by me and considered in my medical decision making (see chart for details).    Treating post neuralgic pain with gabapentin 100 mg every 12 hours.  Advised the medication may cause drowsiness.  Also prescribed triamcinolone cream to apply directly to residual herpetic rash.  May apply up to 3 times daily.  Continue Tylenol and ibuprofen for breakthrough pain.  Follow-up with primary care provider if symptoms worsen or do not improve. Final Clinical Impressions(s) / UC Diagnoses   Final diagnoses:  Post herpetic neuralgia     Discharge Instructions     Apply triamcinolone cream to the area in which the shingles rash developed and pain is present.  You may apply cream 3 times a day. Of also prescribed gabapentin to take 1 tablet every 12 hours as needed.  This medication can cause severe drowsiness therefore start taking medication at bedtime and avoid driving until full effects of medication is known.    ED Prescriptions    Medication Sig Dispense Auth. Provider   triamcinolone (KENALOG) 0.025 % ointment Apply 1 application topically 3 (three) times daily. 454 g Bing Neighbors, FNP   gabapentin (NEURONTIN) 100 MG capsule Take 1 capsule (100 mg total) by mouth 2 (two) times daily. 30 capsule Bing Neighbors, FNP     PDMP not reviewed this encounter.   Bing Neighbors, FNP 07/25/20 1132

## 2020-07-25 NOTE — ED Triage Notes (Signed)
Pt presents with pain on right side. States had shingles last month and has pain in the area of where his shingles were.

## 2020-08-29 ENCOUNTER — Emergency Department (HOSPITAL_COMMUNITY)
Admission: EM | Admit: 2020-08-29 | Discharge: 2020-08-29 | Disposition: A | Discharge status: Home / Self Care | Payer: No Typology Code available for payment source | Attending: Emergency Medicine | Admitting: Emergency Medicine

## 2020-08-29 ENCOUNTER — Encounter (HOSPITAL_COMMUNITY): Payer: Self-pay | Admitting: *Deleted

## 2020-08-29 ENCOUNTER — Other Ambulatory Visit: Payer: Self-pay

## 2020-08-29 DIAGNOSIS — B0229 Other postherpetic nervous system involvement: Secondary | ICD-10-CM | POA: Diagnosis present

## 2020-08-29 DIAGNOSIS — F17210 Nicotine dependence, cigarettes, uncomplicated: Secondary | ICD-10-CM | POA: Diagnosis not present

## 2020-08-29 DIAGNOSIS — Z79899 Other long term (current) drug therapy: Secondary | ICD-10-CM | POA: Diagnosis not present

## 2020-08-29 DIAGNOSIS — I10 Essential (primary) hypertension: Secondary | ICD-10-CM | POA: Diagnosis not present

## 2020-08-29 MED ORDER — LIDOCAINE 5 % EX PTCH: 1.0000 | MEDICATED_PATCH | CUTANEOUS | 0 refills | Status: AC

## 2020-08-29 MED ORDER — ONDANSETRON 4 MG PO TBDP
4.0000 mg | ORAL_TABLET | Freq: Once | ORAL | Status: AC
  Administered 2020-08-29: 4 mg via ORAL
  Filled 2020-08-29: qty 1

## 2020-08-29 MED ORDER — OXYCODONE-ACETAMINOPHEN 5-325 MG PO TABS
2.0000 | ORAL_TABLET | Freq: Once | ORAL | Status: AC
Start: 2020-08-29 — End: 2020-08-29
  Administered 2020-08-29: 2 via ORAL
  Filled 2020-08-29: qty 2

## 2020-08-29 MED ORDER — OXYCODONE-ACETAMINOPHEN 5-325 MG PO TABS: 1.0000 | ORAL_TABLET | Freq: Three times a day (TID) | ORAL | 0 refills | Status: DC | PRN

## 2020-08-29 MED ORDER — LIDOCAINE 5 % EX PTCH: 1.0000 | MEDICATED_PATCH | CUTANEOUS | 0 refills | Status: DC

## 2020-08-29 NOTE — ED Notes (Signed)
Pt discharged ambulatory with family. All questions and concerns addressed. No complaints at this time.   

## 2020-08-29 NOTE — Discharge Instructions (Addendum)
Follow these instructions at home:  It may take a long time to recover from PHN. Work closely with your health care provider and develop a good support system at home. Take over-the-counter and prescription medicines only as told by your health care provider. Do not drive or use heavy machinery while taking prescription pain medicine. Wear loose, comfortable clothing. Cover sensitive areas with a dressing to reduce friction from clothing rubbing on the area. If directed, put ice on the painful area: Put ice in a plastic bag. Place a towel between your skin and the bag. Leave the ice on for 20 minutes, 2-3 times a day. Talk to your health care provider if you feel depressed or desperate. Living with long-term pain can be depressing. Keep all follow-up visits as told by your health care provider. This is important. Contact a health care provider if: Your medicine is not helping. You are struggling to manage your pain at home.

## 2020-08-29 NOTE — ED Notes (Signed)
Pt reports being diagnosed with shingles 9/11. Today is here complaining of pain that has not improved since shingles diagnosis. Pt reports the rash has gone away, but pain is getting worse. Pain is to the entire trunk-- anterior&posterior.   Pt reports tylenol helped pain some and the gabapentin he was prescribed does not help.

## 2020-08-29 NOTE — ED Provider Notes (Signed)
[facility] EMERGENCY DEPARTMENT Provider Note   CSN: [id number] Arrival date & time: 08/29/20  1000     History Chief Complaint  Patient presents with  . Abdominal Pain  . Chest Pain    REASON HELZER is a 64 y.o. male who presents emergency department with chief complaint of severe post herpetic neuralgia pain. Patient states that he was seen at the [facility] in [place] for shingles outbreak in September. He is followed up with his PA there but has had persistent severe sharp constant pain in the region of his outbreak. He states that he is in constant pain despite being on gabapentin which she feels like is not helping at all. He states that the pain is so severe he sometimes "just feels like I do not even want talk to somebody." Patient's wife is at bedside states that he has not been sleeping well and is always in severe pain.  HPI     Past Medical History:  Diagnosis Date  . Hypertension   . Pancreatitis     There are no problems to display for this patient.   History reviewed. No pertinent surgical history.     No family history on file.  Social History   Tobacco Use  . Smoking status: Current Every Day Smoker    Packs/day: 0.50    Types: Cigarettes  . Smokeless tobacco: Never Used  Substance Use Topics  . Alcohol use: No  . Drug use: No    Home Medications Prior to Admission medications   Medication Sig Start Date End Date Taking? Authorizing Provider  amLODipine (NORVASC) 5 MG tablet Take 5 mg by mouth daily.    [provider]  gabapentin (NEURONTIN) 100 MG capsule Take 1 capsule (100 mg total) by mouth 2 (two) times daily. 07/25/20   Bing Neighbors, FNP  ibuprofen (ADVIL,MOTRIN) 600 MG tablet Take 1 tablet (600 mg total) by mouth 3 (three) times daily. 08/12/16   Bethel Born, PA-C  Multiple Vitamins-Minerals (MULTIVITAMIN WITH MINERALS) tablet Take 1 tablet by mouth daily.    [provider]  tamsulosin  (FLOMAX) 0.4 MG CAPS capsule Take 1 capsule (0.4 mg total) by mouth daily. 04/29/15   Fayrene Helper, PA-C  triamcinolone (KENALOG) 0.025 % ointment Apply 1 application topically 3 (three) times daily. 07/25/20   Bing Neighbors, FNP  vitamin E 100 UNIT capsule Take 200 Units by mouth daily.    [provider]    Allergies    Patient has no known allergies.  Review of Systems   Review of Systems Ten systems reviewed and are negative for acute change, except as noted in the HPI.   Physical Exam Updated Vital Signs BP 140/64   Pulse (!) 49   Temp 97.6 F (36.4 C) (Oral)   Resp 18   Ht 5\' 8"  (1.727 m)   Wt 63.5 kg   SpO2 100%   BMI 21.29 kg/m   Physical Exam Vitals and nursing note reviewed.  Constitutional:      General: He is not in acute distress.    Appearance: He is well-developed. He is not diaphoretic.  HENT:     Head: Normocephalic and atraumatic.  Eyes:     General: No scleral icterus.    Conjunctiva/sclera: Conjunctivae normal.  Cardiovascular:     Rate and Rhythm: Normal rate and regular rhythm.     Heart sounds: Normal heart sounds.  Pulmonary:     Effort: Pulmonary effort is  normal. No respiratory distress.     Breath sounds: Normal breath sounds.  Abdominal:     Palpations: Abdomen is soft.     Tenderness: There is no abdominal tenderness.  Musculoskeletal:     Cervical back: Normal range of motion and neck supple.  Skin:    General: Skin is warm and dry.     Comments: Areas of hyperpigmentation consistent with well-healed zoster outbreak on the right side of the body dermatomes T5-8 on the posterior and anterior side of the thorax  Neurological:     Mental Status: He is alert.  Psychiatric:        Behavior: Behavior normal.     ED Results / Procedures / Treatments   Labs (all labs ordered are listed, but only abnormal results are displayed) Labs Reviewed - No data to display  EKG None  Radiology No results  found.  Procedures Procedures (including critical care time)  Medications Ordered in ED Medications - No data to display  ED Course  I have reviewed the triage vital signs and the nursing notes.  Pertinent labs & imaging results that were available during my care of the patient were reviewed by me and considered in my medical decision making (see chart for details).    MDM Rules/Calculators/A&P                          Patient here with postherpetic neuralgia. I do not see any active signs of infection. He is currently taking gabapentin. I reviewed the drug database and see no active narcotic prescriptions. Given the fact that he is in so much pain at this time despite appropriate medications we will give him a short course of narcotic pain medications in the hope that this will help to some degree. He is advised to follow closely with his primary care physician. Final Clinical Impression(s) / ED Diagnoses Final diagnoses:  None    Rx / DC Orders ED Discharge Orders    None       Arthor Captain, PA-C 08/29/20 1341    Sabino Donovan, MD 08/30/20 [id number]

## 2020-08-29 NOTE — ED Triage Notes (Signed)
Patient states he was dx. And tx. For shingles 09/11 and still has the pain , is currently taking Gabepin  C/o pain still along the same site.

## 2020-10-17 ENCOUNTER — Emergency Department (HOSPITAL_COMMUNITY)
Admission: EM | Admit: 2020-10-17 | Discharge: 2020-10-17 | Disposition: A | Discharge status: Home / Self Care | Payer: No Typology Code available for payment source | Attending: Emergency Medicine | Admitting: Emergency Medicine

## 2020-10-17 ENCOUNTER — Other Ambulatory Visit: Payer: Self-pay

## 2020-10-17 ENCOUNTER — Encounter (HOSPITAL_COMMUNITY): Payer: Self-pay | Admitting: Emergency Medicine

## 2020-10-17 ENCOUNTER — Emergency Department (HOSPITAL_COMMUNITY): Payer: No Typology Code available for payment source

## 2020-10-17 DIAGNOSIS — Z79899 Other long term (current) drug therapy: Secondary | ICD-10-CM | POA: Diagnosis not present

## 2020-10-17 DIAGNOSIS — R21 Rash and other nonspecific skin eruption: Secondary | ICD-10-CM | POA: Diagnosis present

## 2020-10-17 DIAGNOSIS — I10 Essential (primary) hypertension: Secondary | ICD-10-CM | POA: Insufficient documentation

## 2020-10-17 DIAGNOSIS — M5431 Sciatica, right side: Secondary | ICD-10-CM

## 2020-10-17 DIAGNOSIS — B0229 Other postherpetic nervous system involvement: Secondary | ICD-10-CM

## 2020-10-17 DIAGNOSIS — F17210 Nicotine dependence, cigarettes, uncomplicated: Secondary | ICD-10-CM | POA: Diagnosis not present

## 2020-10-17 DIAGNOSIS — M5441 Lumbago with sciatica, right side: Secondary | ICD-10-CM | POA: Diagnosis not present

## 2020-10-17 DIAGNOSIS — B0223 Postherpetic polyneuropathy: Secondary | ICD-10-CM | POA: Diagnosis not present

## 2020-10-17 LAB — COMPREHENSIVE METABOLIC PANEL
ALT: 23 U/L (ref 0–44)
AST: 23 U/L (ref 15–41)
Albumin: 4 g/dL (ref 3.5–5.0)
Alkaline Phosphatase: 73 U/L (ref 38–126)
Anion gap: 12 (ref 5–15)
BUN: 10 mg/dL (ref 8–23)
CO2: 25 mmol/L (ref 22–32)
Calcium: 9.4 mg/dL (ref 8.9–10.3)
Chloride: 102 mmol/L (ref 98–111)
Creatinine, Ser: 0.8 mg/dL (ref 0.61–1.24)
GFR, Estimated: >60 mL/min (ref >60)
Glucose, Bld: 112 mg/dL — ABNORMAL HIGH (ref 70–99)
Potassium: 4.7 mmol/L (ref 3.5–5.1)
Sodium: 139 mmol/L (ref 135–145)
Total Bilirubin: 0.5 mg/dL (ref 0.3–1.2)
Total Protein: 7.2 g/dL (ref 6.5–8.1)

## 2020-10-17 LAB — CBC
HCT: 45 % (ref 39.0–52.0)
Hemoglobin: 14.5 g/dL (ref 13.0–17.0)
MCH: 31.3 pg (ref 26.0–34.0)
MCHC: 32.2 g/dL (ref 30.0–36.0)
MCV: 97 fL (ref 80.0–100.0)
Platelets: 176 10*3/uL (ref 150–400)
RBC: 4.64 MIL/uL (ref 4.22–5.81)
RDW: 14.5 % (ref 11.5–15.5)
WBC: 9.4 10*3/uL (ref 4.0–10.5)
nRBC: 0 % (ref 0.0–0.2)

## 2020-10-17 LAB — URINALYSIS, ROUTINE W REFLEX MICROSCOPIC
Bilirubin Urine: NEGATIVE
Glucose, UA: NEGATIVE mg/dL
Hgb urine dipstick: NEGATIVE
Ketones, ur: 5 mg/dL — AB
Leukocytes,Ua: NEGATIVE
Nitrite: NEGATIVE
Protein, ur: NEGATIVE mg/dL
Specific Gravity, Urine: 1.021 (ref 1.005–1.030)
pH: 5 (ref 5.0–8.0)

## 2020-10-17 LAB — LIPASE, BLOOD: Lipase: 17 U/L (ref 11–51)

## 2020-10-17 MED ORDER — METHOCARBAMOL 500 MG PO TABS: 500.0000 mg | ORAL_TABLET | Freq: Two times a day (BID) | ORAL | 0 refills | Status: AC

## 2020-10-17 MED ORDER — OXYCODONE-ACETAMINOPHEN 5-325 MG PO TABS
1.0000 | ORAL_TABLET | Freq: Once | ORAL | Status: AC
  Administered 2020-10-17: 1 via ORAL
  Filled 2020-10-17: qty 1

## 2020-10-17 MED ORDER — PREDNISONE 10 MG PO TABS: 40.0000 mg | ORAL_TABLET | Freq: Every day | ORAL | 0 refills | Status: AC

## 2020-10-17 NOTE — ED Provider Notes (Signed)
[facility] EMERGENCY DEPARTMENT Provider Note   CSN: [id number] Arrival date & time: 10/17/20  0865     History No chief complaint on file.   Elijah Werner is a 65 y.o. male history of pancreatitis, hypertension, shingles.  Patient reports in September 2021 he developed herpes zoster of his right side, he reports that he was placed on pain medication including Percocet and gabapentin.  He has run out of his Percocet x1 month.  He reports gabapentin has not been helping with his pain.  He reports he has had constant pain since September it is a sharp burning sensation moderate-severe in intensity constant worsened with movement and palpation no alleviating factors.  Additionally patient reports he is also developed right side pain that will shoot down his right leg only with certain movements and bending, this is sharp intermittent no clear alleviating factors.  Denies fever/chills, fall/injury, chest pain, abdominal pain, nausea/vomiting, diarrhea, dysuria/hematuria, saddle paresthesias, bowel/bladder incontinence, urinary retention, new rash/lesion or any additional concerns.  HPI     Past Medical History:  Diagnosis Date  . Hypertension   . Pancreatitis     There are no problems to display for this patient.   History reviewed. No pertinent surgical history.     No family history on file.  Social History   Tobacco Use  . Smoking status: Current Every Day Smoker    Packs/day: 0.50    Types: Cigarettes  . Smokeless tobacco: Never Used  Substance Use Topics  . Alcohol use: No  . Drug use: No    Home Medications Prior to Admission medications   Medication Sig Start Date End Date Taking? Authorizing Provider  amLODipine (NORVASC) 5 MG tablet Take 5 mg by mouth daily.   Yes [provider]  gabapentin (NEURONTIN) 400 MG capsule Take 400 mg by mouth in the morning and at bedtime.   Yes [provider]  lidocaine (LIDODERM) 5 %  Place 1 patch onto the skin daily. Remove & Discard patch within 12 hours or as directed by MD 08/29/20  Yes Arthor Captain, PA-C  methocarbamol (ROBAXIN) 500 MG tablet Take 1 tablet (500 mg total) by mouth 2 (two) times daily for 7 days. 10/17/20 10/24/20 Yes Harlene Salts A, PA-C  predniSONE (DELTASONE) 10 MG tablet Take 4 tablets (40 mg total) by mouth daily for 5 days. 10/17/20 10/22/20 Yes Harlene Salts A, PA-C  VITAMIN E PO Take 1 capsule by mouth daily.   Yes [provider]    Allergies    Patient has no known allergies.  Review of Systems   Review of Systems Ten systems are reviewed and are negative for acute change except as noted in the HPI  Physical Exam Updated Vital Signs BP (!) 149/72 (BP Location: Left Arm)   Pulse (!) 47   Temp 98.4 F (36.9 C) (Oral)   Resp 16   SpO2 99%   Physical Exam Constitutional:      General: He is not in acute distress.    Appearance: Normal appearance. He is well-developed. He is not ill-appearing or diaphoretic.  HENT:     Head: Normocephalic and atraumatic.  Eyes:     General: Vision grossly intact. Gaze aligned appropriately.     Pupils: Pupils are equal, round, and reactive to light.  Neck:     Trachea: Trachea and phonation normal.  Cardiovascular:     Pulses:          Dorsalis pedis pulses are  2+ on the right side and 2+ on the left side.  Pulmonary:     Effort: Pulmonary effort is normal. No respiratory distress.  Abdominal:     General: There is no distension.     Palpations: Abdomen is soft.     Tenderness: There is no abdominal tenderness. There is no guarding or rebound.  Musculoskeletal:        General: Normal range of motion.     Cervical back: Normal range of motion.     Comments: No midline C/T/L spinal tenderness to palpation, no deformity, crepitus, or step-off noted. No sign of injury to the neck or back. - Right lumbar paraspinal muscular tenderness to palpation.  Positive straight leg raise right  side.  Feet:     Right foot:     Protective Sensation: 3 sites tested. 3 sites sensed.     Left foot:     Protective Sensation: 3 sites tested. 3 sites sensed.  Skin:    General: Skin is warm and dry.          Comments: Scarring consistent with herpes zoster infection, no new lesions.  Neurological:     Mental Status: He is alert.     GCS: GCS eye subscore is 4. GCS verbal subscore is 5. GCS motor subscore is 6.     Comments: Speech is clear and goal oriented, follows commands Major Cranial nerves without deficit, no facial droop Moves extremities without ataxia, coordination intact  Psychiatric:        Behavior: Behavior normal.     ED Results / Procedures / Treatments   Labs (all labs ordered are listed, but only abnormal results are displayed) Labs Reviewed  COMPREHENSIVE METABOLIC PANEL - Abnormal; Notable for the following components:      Result Value   Glucose, Bld 112 (*)    All other components within normal limits  URINALYSIS, ROUTINE W REFLEX MICROSCOPIC - Abnormal; Notable for the following components:   APPearance HAZY (*)    Ketones, ur 5 (*)    All other components within normal limits  LIPASE, BLOOD  CBC    EKG None  Radiology DG Lumbar Spine Complete  Result Date: 10/17/2020 CLINICAL DATA:  Right sciatica.  Right flank pain. EXAM: LUMBAR SPINE - COMPLETE 4+ VIEW COMPARISON:  None. FINDINGS: Calcifications just to the right of midline in the upper abdomen are consistent with calcifications in the pancreatic head seen on previous CT imaging. No fracture or traumatic malalignment. Multilevel degenerative disc disease with anterior osteophytes. IMPRESSION: 1. Degenerative disc disease. 2. 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 Electronically Signed   By: Gerome Sam III M.D   On: 10/17/2020 18:05    Procedures Procedures (including critical care time)  Medications Ordered in ED Medications  oxyCODONE-acetaminophen (PERCOCET/ROXICET) 5-325 MG per  tablet 1 tablet (1 tablet Oral Given 10/17/20 1612)    ED Course  I have reviewed the triage vital signs and the nursing notes.  Pertinent labs & imaging results that were available during my care of the patient were reviewed by me and considered in my medical decision making (see chart for details).    MDM Rules/Calculators/A&P                         Additional history obtained from: 1. Nursing notes from this visit. 2. Review of electronic medical records.  Patient seen in the ER 08/29/2020 diagnosis postherpetic neuralgia.  Patient treated with Percocet and Lidoderm. --------------------  65 year old male presented for pain associated with herpes zoster that first occurred September 2021.  He has residual pain of the area is unchanged for the past several months.  He has no new lesions, there is no indication for antivirals at this time.  Patient is on pregabalin prescribed by his PCP with minimal improvement, he does report he has Lidoderm at home which does help his pain.  Given chronicity of symptoms do not feel it is appropriate to refill oxycodone at this time, I have encouraged patient to follow-up with his PCP for recheck this week.  Additionally patient presented for right low back pain shooting down his right leg history of physical examination is consistent with right-sided sciatica.  He has no high risk factors to suggest spinal epidural abscess.  He has no history of fall or injury.  No neurologic complaint to suggest cauda equina.  Urinalysis shows no hemoglobin to suggest kidney stone disease.  He reports his pain has been ongoing for some time and has not had imaging before x-ray was obtained and shows some degenerative change of the spine which is likely contributing to his pain.  Will refer patient to orthopedic for further evaluation.  Incidentally patient had calcification of the pancreatic head seen on x-ray, he has no abdominal pain nausea vomiting and normal lipase doubt acute  pancreatitis no negation further work-up at this time.  Patient was advised of incidental findings and follow-up with his PCP.  Lab work obtained in triage was reviewed shows no emergent findings.  CBC without leukocytosis to suggest infection, no evidence of anemia.  CMP shows no emergent lecture derangement, AKI, LFT elevations or gap.  Normal bicarb.  Urinalysis without evidence of infection.  Will treat patient's right-sided sciatica with prednisone burst, he denies any adverse reaction to steroid medications or history of diabetes.  Will prescribe patient 5-day course of 40 mg prednisone.  Additionally will prescribe patient Robaxin 500 mg twice daily x7 days, I discussed muscle relaxer precautions with patient and he stated understanding.   At this time there does not appear to be any evidence of an acute emergency medical condition and the patient appears stable for discharge with appropriate outpatient follow up. Diagnosis was discussed with patient who verbalizes understanding of care plan and is agreeable to discharge. I have discussed return precautions with patient who verbalizes understanding. Patient encouraged to follow-up with their PCP and ortho. All questions answered.  Patient's case discussed with Dr. Wilkie Aye who agrees with plan to discharge with follow-up.   Note: Portions of this report may have been transcribed using voice recognition software. Every effort was made to ensure accuracy; however, inadvertent computerized transcription errors may still be present. Final Clinical Impression(s) / ED Diagnoses Final diagnoses:  HZV (herpes zoster virus) post herpetic neuralgia  Right sciatic nerve pain    Rx / DC Orders ED Discharge Orders         Ordered    methocarbamol (ROBAXIN) 500 MG tablet  2 times daily        10/17/20 1850    predniSONE (DELTASONE) 10 MG tablet  Daily        10/17/20 1850           Elizabeth Palau 10/17/20 1908    Rozelle Logan,  DO 10/17/20 2220

## 2020-10-17 NOTE — ED Triage Notes (Signed)
Pt reports R sided abd pain, R sided back pain, and bilateral leg pain since having shingles in September.  Denies nausea, vomiting, diarrhea, and urinary complaints.

## 2020-10-17 NOTE — Discharge Instructions (Addendum)
At this time there does not appear to be the presence of an emergent medical condition, however there is always the potential for conditions to change. Please read and follow the below instructions.  Please return to the Emergency Department immediately for any new or worsening symptoms. Please be sure to follow up with your Primary Care Provider within one week regarding your visit today; please call their office to schedule an appointment even if you are feeling better for a follow-up visit. You may use the muscle relaxer Robaxin as prescribed to help with your symptoms.  Do not drive or operate heavy machinery while taking Robaxin as it will make you drowsy.  Do not drink alcohol or take other sedating medications while taking Robaxin as this will worsen side effects. You have been prescribed prednisone to help with your sciatica pain.  Please take it as prescribed.  As we discussed if you have a history of diabetes do not take prednisone as will make your blood sugar levels go up. You may continue using your Lidoderm patches as prescribed by her primary care provider to help with your symptoms. Your x-ray today showed degenerative changes of your spine which are likely contributing to your sciatica pain.  You may follow-up with the orthopedic specialist Dr. Ave Filter on your discharge paperwork for a follow-up visit. Your x-ray today showed calcifications of your pancreatic head, this is likely due to your history of pancreatitis.  Please discuss this incidental finding with your primary care provider at your follow-up visit as they might want to do an ultrasound or further work-up if needed. Your urine today showed some ketones compasses likely from dehydration please drink plenty of water to avoid dehydration.  Please have your labs rechecked by primary care provider at your follow-up visit.  Go to the nearest Emergency Department immediately if: You have fever or chills You cannot control when you  pee (urinate) or poop (have a bowel movement). You have weakness in any of these areas and it gets worse: Lower back. The area between your hip bones. Butt. Legs. You have redness or swelling of your back. You have a burning feeling when you pee. You have any new/concerning or worsening of symptoms   Please read the additional information packets attached to your discharge summary.  Do not take your medicine if  develop an itchy rash, swelling in your mouth or lips, or difficulty breathing; call 911 and seek immediate emergency medical attention if this occurs.  You may review your lab tests and imaging results in their entirety on your MyChart account.  Please discuss all results of fully with your primary care provider and other specialist at your follow-up visit.  Note: Portions of this text may have been transcribed using voice recognition software. Every effort was made to ensure accuracy; however, inadvertent computerized transcription errors may still be present.

## 2020-10-17 NOTE — ED Notes (Signed)
Tell PT sister called [facility] and Ivar Drape May called

## 2020-10-17 NOTE — ED Notes (Signed)
Pt ambulated to bathroom independently

## 2020-10-17 NOTE — ED Notes (Signed)
Discharge instructions discussed with pt. Pt verbalized understanding. Pt stable and ambulatory. No singature pad available

## 2022-04-04 ENCOUNTER — Ambulatory Visit (HOSPITAL_COMMUNITY)
Admission: EM | Admit: 2022-04-04 | Discharge: 2022-04-04 | Disposition: A | Discharge status: Home / Self Care | Payer: No Typology Code available for payment source

## 2022-04-04 ENCOUNTER — Encounter (HOSPITAL_COMMUNITY): Payer: Self-pay | Admitting: Emergency Medicine

## 2022-04-04 DIAGNOSIS — L989 Disorder of the skin and subcutaneous tissue, unspecified: Secondary | ICD-10-CM

## 2023-02-01 ENCOUNTER — Other Ambulatory Visit: Payer: Self-pay | Admitting: Nurse Practitioner

## 2023-02-01 DIAGNOSIS — F17210 Nicotine dependence, cigarettes, uncomplicated: Secondary | ICD-10-CM

## 2023-05-12 DIAGNOSIS — I10 Essential (primary) hypertension: Secondary | ICD-10-CM | POA: Diagnosis not present

## 2023-05-12 DIAGNOSIS — E440 Moderate protein-calorie malnutrition: Secondary | ICD-10-CM | POA: Diagnosis not present

## 2024-09-24 ENCOUNTER — Other Ambulatory Visit (HOSPITAL_COMMUNITY): Payer: Self-pay | Admitting: Family Medicine

## 2024-09-24 DIAGNOSIS — F17210 Nicotine dependence, cigarettes, uncomplicated: Secondary | ICD-10-CM

## 2024-10-01 ENCOUNTER — Ambulatory Visit (HOSPITAL_COMMUNITY)
Admission: RE | Admit: 2024-10-01 | Discharge: 2024-10-01 | Disposition: A | Discharge status: Home / Self Care | Source: Ambulatory Visit | Attending: Family Medicine | Admitting: Family Medicine

## 2024-10-01 DIAGNOSIS — F17210 Nicotine dependence, cigarettes, uncomplicated: Secondary | ICD-10-CM | POA: Insufficient documentation
